# Patient Record
Sex: Female | Born: 1970 | Race: White | Hispanic: No | Marital: Married | Smoking: Current every day smoker
Health system: Southern US, Community
[De-identification: ages and names within clinical notes are randomized; demographics above are authoritative.]

## PROBLEM LIST (undated history)

## (undated) DIAGNOSIS — Z803 Family history of malignant neoplasm of breast: Secondary | ICD-10-CM

## (undated) DIAGNOSIS — Z806 Family history of leukemia: Secondary | ICD-10-CM

## (undated) DIAGNOSIS — E785 Hyperlipidemia, unspecified: Secondary | ICD-10-CM

## (undated) DIAGNOSIS — F32A Depression, unspecified: Secondary | ICD-10-CM

## (undated) DIAGNOSIS — F419 Anxiety disorder, unspecified: Secondary | ICD-10-CM

## (undated) DIAGNOSIS — S0990XA Unspecified injury of head, initial encounter: Secondary | ICD-10-CM

## (undated) DIAGNOSIS — Z923 Personal history of irradiation: Secondary | ICD-10-CM

## (undated) DIAGNOSIS — F329 Major depressive disorder, single episode, unspecified: Secondary | ICD-10-CM

## (undated) DIAGNOSIS — C801 Malignant (primary) neoplasm, unspecified: Secondary | ICD-10-CM

## (undated) DIAGNOSIS — Z9221 Personal history of antineoplastic chemotherapy: Secondary | ICD-10-CM

## (undated) HISTORY — DX: Family history of leukemia: Z80.6

## (undated) HISTORY — DX: Major depressive disorder, single episode, unspecified: F32.9

## (undated) HISTORY — PX: DILATION AND CURETTAGE OF UTERUS: SHX78

## (undated) HISTORY — DX: Unspecified injury of head, initial encounter: S09.90XA

## (undated) HISTORY — DX: Hyperlipidemia, unspecified: E78.5

## (undated) HISTORY — DX: Depression, unspecified: F32.A

## (undated) HISTORY — DX: Anxiety disorder, unspecified: F41.9

## (undated) HISTORY — DX: Family history of malignant neoplasm of breast: Z80.3

---
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
## 1991-04-30 DIAGNOSIS — S0990XA Unspecified injury of head, initial encounter: Secondary | ICD-10-CM

## 1991-04-30 HISTORY — PX: LUNG SURGERY: SHX703

## 1991-04-30 HISTORY — PX: HAND SURGERY: SHX662

## 1991-04-30 HISTORY — DX: Unspecified injury of head, initial encounter: S09.90XA

## 2005-02-20 ENCOUNTER — Inpatient Hospital Stay: Payer: Self-pay | Admitting: Internal Medicine

## 2006-03-04 ENCOUNTER — Ambulatory Visit: Payer: Self-pay | Admitting: Family Medicine

## 2006-03-04 ENCOUNTER — Encounter: Payer: Self-pay | Admitting: Family Medicine

## 2006-03-07 ENCOUNTER — Ambulatory Visit: Payer: Self-pay | Admitting: Family Medicine

## 2006-11-23 ENCOUNTER — Emergency Department: Payer: Self-pay | Admitting: Emergency Medicine

## 2007-06-02 ENCOUNTER — Ambulatory Visit: Payer: Self-pay | Admitting: Internal Medicine

## 2007-07-07 ENCOUNTER — Ambulatory Visit: Payer: Self-pay | Admitting: Family Medicine

## 2007-07-07 ENCOUNTER — Encounter: Payer: Self-pay | Admitting: Family Medicine

## 2008-06-21 ENCOUNTER — Ambulatory Visit: Payer: Self-pay | Admitting: Obstetrics & Gynecology

## 2008-06-22 ENCOUNTER — Encounter: Payer: Self-pay | Admitting: Family Medicine

## 2008-06-22 LAB — CONVERTED CEMR LAB: Yeast Wet Prep HPF POC: NONE SEEN

## 2008-11-30 ENCOUNTER — Encounter: Payer: Self-pay | Admitting: Obstetrics & Gynecology

## 2008-11-30 ENCOUNTER — Ambulatory Visit: Payer: Self-pay | Admitting: Obstetrics & Gynecology

## 2008-12-01 ENCOUNTER — Encounter: Payer: Self-pay | Admitting: Obstetrics & Gynecology

## 2008-12-01 LAB — CONVERTED CEMR LAB
Clue Cells Wet Prep HPF POC: NONE SEEN
Yeast Wet Prep HPF POC: NONE SEEN

## 2008-12-09 ENCOUNTER — Ambulatory Visit: Payer: Self-pay | Admitting: Obstetrics & Gynecology

## 2008-12-09 ENCOUNTER — Encounter (INDEPENDENT_AMBULATORY_CARE_PROVIDER_SITE_OTHER): Payer: Self-pay | Admitting: *Deleted

## 2009-11-12 ENCOUNTER — Ambulatory Visit: Payer: Self-pay | Admitting: Family Medicine

## 2010-04-17 ENCOUNTER — Ambulatory Visit: Payer: Self-pay | Admitting: Family Medicine

## 2010-08-24 ENCOUNTER — Ambulatory Visit: Payer: Self-pay | Admitting: Internal Medicine

## 2010-09-11 NOTE — Assessment & Plan Note (Signed)
NAME:  [name], [name] NO.:  [id number]   MEDICAL RECORD NO.:  [id number]          PATIENT TYPE:  POB   LOCATION:  [facility] at [facility]         FACILITY:  [facility]   PHYSICIAN:  Scheryl Darter, MD       DATE OF BIRTH:  01-12-1971   DATE OF SERVICE:  11/30/2008                                  CLINIC NOTE   CHIEF COMPLAINT:  Need for a physical exam, facial acne, and vaginal  discharge.   The patient is a 40 year old white female gravida 3, para 1, abortus 2,  last menstrual period November 21, 2008.  She is currently not needing any  contraception.  She comes for yearly exam.  She has slight vaginal  discharge, but no itching or odor, and she thinks that it may be because  of ovulation.  She also notes more facial acne and she would like to  have a prescription to help for that.  She plans to quit smoking.  She  had been given Wellbutrin before to help with it and she did not think  it helped.   PAST MEDICAL HISTORY:  Anxiety.   PAST SURGICAL HISTORY:  The patient was curettaged for spontaneous  miscarriages.   PAST OBSTETRIC HISTORY:  The patient has had 2 spontaneous miscarriages  and 1 spontaneous vaginal delivery at term.   FAMILY HISTORY:  Diabetes, breast cancer, and leukemia.   SOCIAL HISTORY:  The patient is single and smoker.  She occasionally  uses alcohol, but denies drug use.   ALLERGIES:  No known drug allergies.   REVIEW OF SYSTEMS:  The patient denies nausea, diarrhea, abdominal pain,  dysuria, change in weight.  She has normal appetite.   PHYSICAL EXAMINATION:  GENERAL:  The patient is in no acute distress.  VITAL SIGNS:  Weight 126 pounds, height 5 feet 4 inches, blood pressure  106/68, pulse 71.  CHEST:  Clear.  HEART:  Regular rate and rhythm.  ABDOMEN:  Soft, nontender.  No masses.  BREASTS:  Without masses.  No cervical lymphadenopathy.  EXTREMITIES:  No swelling.  PELVIC:  External genitalia, vagina, and cervix are normal with a slight  discharge which may be only mucus.  Wet mount was obtained today.  Pap  was done.  Uterus normal size.  No adnexal masses or tenderness.  The  patient does have some facial acne.   IMPRESSION:  1. Normal gynecologic exam, likely no vaginal infections ongoing.  2. Facial acne.  3. Smoker, who wished to quit.   PLAN:  Gave her a prescription for metronidazole gel 0.75% to apply  t.i.d. for acne.  She wants to try Chantix for smoking cessation.  I  gave her a prescription for this to be taken as directed.  She will  return in about a month to review her progress with smoking cessation.  She will begin screening mammograms.       Scheryl Darter, MD     JA/MEDQ  D:  11/30/2008  T:  12/01/2008  Job:  [id number]
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
## 2010-09-11 NOTE — Assessment & Plan Note (Signed)
NAME:  [name] NO.:  [id number]   MEDICAL RECORD NO.:  [id number]          PATIENT TYPE:  POB   LOCATION:  [facility] at [facility]         FACILITY:  [facility]   PHYSICIAN:  Tinnie Gens, MD        DATE OF BIRTH:  08/17/1970   DATE OF SERVICE:                                  CLINIC NOTE   CHIEF COMPLAINT:  Physical exam.   HISTORY OF PRESENT ILLNESS:  The patient is a 40 year old gravida 3,  para 1 who comes in today for yearly exam, her last yearly was November  2007.  Since that time, she has had some problems with breaking out in  hives with unclear etiology for that.  She sees Dr. Arlana Pouch as her PCP who  is trying to do her workup.  The patient reports recurrent UTIs as well.  She has problems with sweating, not sleeping well, worsening acne and  early morning rising. She denies significant depression, although she is  having some marital strife.  She would like birth control.  She  continues to have normal cycles.  She is interested in a NuvaRing. She  continues to smoke, but is interested in quitting.   PAST MEDICAL HISTORY:  Negative.   PAST SURGICAL HISTORY:  She had a D&C and pins in her right forearm.   MEDICATIONS:  1. Feostat 1 p.o. daily.  2. Tylenol p.r.n.  3. Ibuprofen p.r.n.  4. Klonopin 1 p.o. q.h.s.  5. Atarax 1 p.o. q.h.s.   ALLERGIES:  NONE KNOWN.   OBSTETRICAL HISTORY:  She has 2 SABs with DNEs and 1 SVD.   GYNECOLOGICAL HISTORY:  Normal cycles.  No history of abnormal Paps.   FAMILY HISTORY:  Diabetes, breast cancer and leukemia.   SOCIAL HISTORY:  She is a pack per day smoker for the past 14 years.  Social alcohol user.  No drug use.  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   REVIEW OF SYSTEMS:  A 14-point review of systems:  She denies headache,  vision changes, chest pain, shortness of breath, abdominal pain, breast  masses, vaginal discharge, vaginal odor, dysuria, constipation or  diarrhea.  Everything else was  negative except as in the HPI.   PHYSICAL EXAMINATION:  VITAL SIGNS:  Weigh is 124 today blood pressure  109/60, pulse 84.  GENERAL:  This is a well-developed, well-nourished female in no acute  distress.  NECK:  Supple.  HEENT:  Normocephalic, atraumatic.  Sclerae anicteric.  Mouth shows  probable gum disease.  NECK:  Supple.  Normal thyroid.  LUNGS:  Clear bilaterally.  CV:  Regular rate and rhythm without rubs, gallops or murmurs.  ABDOMEN:  Soft, nontender, nondistended.  EXTREMITIES:  No cyanosis, clubbing or edema.  BREASTS:  Symmetric with everted nipples.  No masses.  She does have  fibrocystic change especially in the left outer quadrant of that breast.  No supraclavicular or axillary adenopathy.  GU:  Normal external female genitalia.  BUS is normal.  Vagina is pink  and rugated.  Cervix is parous without lesion.  Uterus is small,  anteverted.  No adnexal mass or tenderness.   IMPRESSION:  1.  Physical exam.  2. Tobacco use.   PLAN:  1. Pap smear today.  2. Wellbutrin 150 mg p.o. b.i.d.  Information given about Quit Line.  3. Check cholesterol, CBC, CMP, TSH, FSH today.           ______________________________  Tinnie Gens, MD     TP/MEDQ  D:  07/07/2007  T:  07/08/2007  Job:  [id number]   cc:   Arlana Pouch, MD
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
## 2012-01-17 ENCOUNTER — Ambulatory Visit: Payer: Self-pay | Admitting: Internal Medicine

## 2012-01-24 ENCOUNTER — Ambulatory Visit (INDEPENDENT_AMBULATORY_CARE_PROVIDER_SITE_OTHER): Payer: BC Managed Care – PPO | Admitting: Obstetrics & Gynecology

## 2012-01-24 ENCOUNTER — Encounter: Payer: Self-pay | Admitting: Obstetrics & Gynecology

## 2012-01-24 VITALS — BP 107/70 | HR 71 | Ht 64.0 in | Wt 128.0 lb

## 2012-01-24 DIAGNOSIS — Z01419 Encounter for gynecological examination (general) (routine) without abnormal findings: Secondary | ICD-10-CM

## 2012-01-24 DIAGNOSIS — Z124 Encounter for screening for malignant neoplasm of cervix: Secondary | ICD-10-CM

## 2012-01-24 DIAGNOSIS — B373 Candidiasis of vulva and vagina: Secondary | ICD-10-CM

## 2012-01-24 DIAGNOSIS — B3731 Acute candidiasis of vulva and vagina: Secondary | ICD-10-CM

## 2012-01-24 DIAGNOSIS — Z1151 Encounter for screening for human papillomavirus (HPV): Secondary | ICD-10-CM

## 2012-01-24 MED ORDER — FLUCONAZOLE 150 MG PO TABS: 150.0000 mg | ORAL_TABLET | Freq: Once | ORAL | Status: DC

## 2012-01-24 NOTE — Progress Notes (Signed)
  Subjective:     [name] is a 41 y.o. G47P1021 female and is here for a comprehensive physical exam. The patient reports no problems.  History   Social History  . Marital Status: Married    Spouse Name: N/A    Number of Children: N/A  . Years of Education: N/A   Occupational History  . Not on file.   Social History Main Topics  . Smoking status: Not on file  . Smokeless tobacco: Not on file  . Alcohol Use: Not on file  . Drug Use: Not on file  . Sexually Active: Not on file   Other Topics Concern  . Not on file   Social History Narrative  . No narrative on file   Health Maintenance  Topic Date Due  . Pap Smear  01/26/1987  . Tetanus/tdap  01/26/1988  . Influenza Vaccine  12/29/2011    The following portions of the patient's history were reviewed and updated as appropriate: allergies, current medications, past family history, past medical history, past social history, past surgical history and problem list.  Review of Systems Pertinent items are noted in HPI.   Objective:   Blood pressure 107/70, pulse 71, height 5\' 4"  (1.626 m), weight 128 lb (58.06 kg), last menstrual period 12/27/2011. GENERAL: Well-developed, well-nourished female in no acute distress.  HEENT: Normocephalic, atraumatic. Sclerae anicteric.  NECK: Supple. Normal thyroid.  LUNGS: Clear to auscultation bilaterally.  HEART: Regular rate and rhythm. BREASTS: Symmetric with everted nipples. No masses, skin changes, nipple drainage, or lymphadenopathy. ABDOMEN: Soft, nontender, nondistended. No organomegaly. PELVIC: Normal external female genitalia. Vagina is pink and rugated.  Normal discharge. Normal cervix contour. Pap smear obtained. Uterus is normal in size. No adnexal mass or tenderness.  EXTREMITIES: No cyanosis, clubbing, or edema, 2+ distal pulses.   Assessment:    Healthy female exam.      Plan:    Pap done, follow up results and manage accordingly. Mammogram scheduled Routine  preventative health maintenance measures emphasized See After Visit Summary for other Counseling Recommendations

## 2012-01-24 NOTE — Patient Instructions (Signed)
Preventive Care for Adults, Female A healthy lifestyle and preventive care can promote health and wellness. Preventive health guidelines for women include the following key practices.  A routine yearly physical is a good way to check with your caregiver about your health and preventive screening. It is a chance to share any concerns and updates on your health, and to receive a thorough exam.   Visit your dentist for a routine exam and preventive care every 6 months. Brush your teeth twice a day and floss once a day. Good oral hygiene prevents tooth decay and gum disease.   The frequency of eye exams is based on your age, health, family medical history, use of contact lenses, and other factors. Follow your caregiver's recommendations for frequency of eye exams.   Eat a healthy diet. Foods like vegetables, fruits, whole grains, low-fat dairy products, and lean protein foods contain the nutrients you need without too many calories. Decrease your intake of foods high in solid fats, added sugars, and salt. Eat the right amount of calories for you.Get information about a proper diet from your caregiver, if necessary.   Regular physical exercise is one of the most important things you can do for your health. Most adults should get at least 150 minutes of moderate-intensity exercise (any activity that increases your heart rate and causes you to sweat) each week. In addition, most adults need muscle-strengthening exercises on 2 or more days a week.   Maintain a healthy weight. The body mass index (BMI) is a screening tool to identify possible weight problems. It provides an estimate of body fat based on height and weight. Your caregiver can help determine your BMI, and can help you achieve or maintain a healthy weight.For adults 20 years and older:   A BMI below 18.5 is considered underweight.   A BMI of 18.5 to 24.9 is normal.   A BMI of 25 to 29.9 is considered overweight.   A BMI of 30 and above  is considered obese.   Maintain normal blood lipids and cholesterol levels by exercising and minimizing your intake of saturated fat. Eat a balanced diet with plenty of fruit and vegetables. Blood tests for lipids and cholesterol should begin at age 54 and be repeated every 5 years. If your lipid or cholesterol levels are high, you are over 50, or you are at high risk for heart disease, you may need your cholesterol levels checked more frequently.Ongoing high lipid and cholesterol levels should be treated with medicines if diet and exercise are not effective.   If you smoke, find out from your caregiver how to quit. If you do not use tobacco, do not start.   If you are pregnant, do not drink alcohol. If you are breastfeeding, be very cautious about drinking alcohol. If you are not pregnant and choose to drink alcohol, do not exceed 1 drink per day. One drink is considered to be 12 ounces (355 mL) of beer, 5 ounces (148 mL) of wine, or 1.5 ounces (44 mL) of liquor.   Avoid use of street drugs. Do not share needles with anyone. Ask for help if you need support or instructions about stopping the use of drugs.   High blood pressure causes heart disease and increases the risk of stroke. Your blood pressure should be checked at least every 1 to 2 years. Ongoing high blood pressure should be treated with medicines if weight loss and exercise are not effective.   If you are 55 to 43  years old, ask your caregiver if you should take aspirin to prevent strokes.   Diabetes screening involves taking a blood sample to check your fasting blood sugar level. This should be done once every 3 years, after age 39, if you are within normal weight and without risk factors for diabetes. Testing should be considered at a younger age or be carried out more frequently if you are overweight and have at least 1 risk factor for diabetes.   Breast cancer screening is essential preventive care for women. You should practice  "breast self-awareness." This means understanding the normal appearance and feel of your breasts and may include breast self-examination. Any changes detected, no matter how small, should be reported to a caregiver. Women in their 56s and 30s should have a clinical breast exam (CBE) by a caregiver as part of a regular health exam every 1 to 3 years. After age 16, women should have a CBE every year. Starting at age 51, women should consider having a mammography (breast X-ray test) every year. Women who have a family history of breast cancer should talk to their caregiver about genetic screening. Women at a high risk of breast cancer should talk to their caregivers about having magnetic resonance imaging (MRI) and a mammography every year.   The Pap test is a screening test for cervical cancer. A Pap test can show cell changes on the cervix that might become cervical cancer if left untreated. A Pap test is a procedure in which cells are obtained and examined from the lower end of the uterus (cervix).   Women should have a Pap test starting at age 30.   Between ages 79 and 67, Pap tests should be repeated every 2 years.   Beginning at age 54, you should have a Pap test every 3 years as long as the past 3 Pap tests have been normal.   Some women have medical problems that increase the chance of getting cervical cancer. Talk to your caregiver about these problems. It is especially important to talk to your caregiver if a new problem develops soon after your last Pap test. In these cases, your caregiver may recommend more frequent screening and Pap tests.   The above recommendations are the same for women who have or have not gotten the vaccine for human papillomavirus (HPV).   If you had a hysterectomy for a problem that was not cancer or a condition that could lead to cancer, then you no longer need Pap tests. Even if you no longer need a Pap test, a regular exam is a good idea to make sure no other  problems are starting.   If you are between ages 63 and 40, and you have had normal Pap tests going back 10 years, you no longer need Pap tests. Even if you no longer need a Pap test, a regular exam is a good idea to make sure no other problems are starting.   If you have had past treatment for cervical cancer or a condition that could lead to cancer, you need Pap tests and screening for cancer for at least 20 years after your treatment.   If Pap tests have been discontinued, risk factors (such as a new sexual partner) need to be reassessed to determine if screening should be resumed.   The HPV test is an additional test that may be used for cervical cancer screening. The HPV test looks for the virus that can cause the cell changes on the cervix.  The cells collected during the Pap test can be tested for HPV. The HPV test could be used to screen women aged 64 years and older, and should be used in women of any age who have unclear Pap test results. After the age of 38, women should have HPV testing at the same frequency as a Pap test.   Colorectal cancer can be detected and often prevented. Most routine colorectal cancer screening begins at the age of 62 and continues through age 35. However, your caregiver may recommend screening at an earlier age if you have risk factors for colon cancer. On a yearly basis, your caregiver may provide home test kits to check for hidden blood in the stool. Use of a small camera at the end of a tube, to directly examine the colon (sigmoidoscopy or colonoscopy), can detect the earliest forms of colorectal cancer. Talk to your caregiver about this at age 11, when routine screening begins. Direct examination of the colon should be repeated every 5 to 10 years through age 47, unless early forms of pre-cancerous polyps or small growths are found.   Hepatitis C blood testing is recommended for all people born from [birth year] through 1965 and any individual with known risks for  hepatitis C.   Practice safe sex. Use condoms and avoid high-risk sexual practices to reduce the spread of sexually transmitted infections (STIs). STIs include gonorrhea, chlamydia, syphilis, trichomonas, herpes, HPV, and human immunodeficiency virus (HIV). Herpes, HIV, and HPV are viral illnesses that have no cure. They can result in disability, cancer, and death. Sexually active women aged 36 and younger should be checked for chlamydia. Older women with new or multiple partners should also be tested for chlamydia. Testing for other STIs is recommended if you are sexually active and at increased risk.   Osteoporosis is a disease in which the bones lose minerals and strength with aging. This can result in serious bone fractures. The risk of osteoporosis can be identified using a bone density scan. Women ages 96 and over and women at risk for fractures or osteoporosis should discuss screening with their caregivers. Ask your caregiver whether you should take a calcium supplement or vitamin D to reduce the rate of osteoporosis.   Menopause can be associated with physical symptoms and risks. Hormone replacement therapy is available to decrease symptoms and risks. You should talk to your caregiver about whether hormone replacement therapy is right for you.   Use sunscreen with sun protection factor (SPF) of 30 or more. Apply sunscreen liberally and repeatedly throughout the day. You should seek shade when your shadow is shorter than you. Protect yourself by wearing long sleeves, pants, a wide-brimmed hat, and sunglasses year round, whenever you are outdoors.   Once a month, do a whole body skin exam, using a mirror to look at the skin on your back. Notify your caregiver of new moles, moles that have irregular borders, moles that are larger than a pencil eraser, or moles that have changed in shape or color.   Stay current with required immunizations.   Influenza. You need a dose every fall (or winter). The  composition of the flu vaccine changes each year, so being vaccinated once is not enough.   Pneumococcal polysaccharide. You need 1 to 2 doses if you smoke cigarettes or if you have certain chronic medical conditions. You need 1 dose at age 67 (or older) if you have never been vaccinated.   Tetanus, diphtheria, pertussis (Tdap, Td). Get 1 dose of  Tdap vaccine if you are younger than age 65, are over 65 and have contact with an infant, are a healthcare worker, are pregnant, or simply want to be protected from whooping cough. After that, you need a Td booster dose every 10 years. Consult your caregiver if you have not had at least 3 tetanus and diphtheria-containing shots sometime in your life or have a deep or dirty wound.   HPV. You need this vaccine if you are a woman age 26 or younger. The vaccine is given in 3 doses over 6 months.   Measles, mumps, rubella (MMR). You need at least 1 dose of MMR if you were born in 1957 or later. You may also need a second dose.   Meningococcal. If you are age 19 to 21 and a first-year college student living in a residence hall, or have one of several medical conditions, you need to get vaccinated against meningococcal disease. You may also need additional booster doses.   Zoster (shingles). If you are age 60 or older, you should get this vaccine.   Varicella (chickenpox). If you have never had chickenpox or you were vaccinated but received only 1 dose, talk to your caregiver to find out if you need this vaccine.   Hepatitis A. You need this vaccine if you have a specific risk factor for hepatitis A virus infection or you simply wish to be protected from this disease. The vaccine is usually given as 2 doses, 6 to 18 months apart.   Hepatitis B. You need this vaccine if you have a specific risk factor for hepatitis B virus infection or you simply wish to be protected from this disease. The vaccine is given in 3 doses, usually over 6 months.  Preventive Services /  Frequency Ages 19 to 39  Blood pressure check.** / Every 1 to 2 years.   Lipid and cholesterol check.** / Every 5 years beginning at age 20.   Clinical breast exam.** / Every 3 years for women in their 20s and 30s.   Pap test.** / Every 2 years from ages 21 through 29. Every 3 years starting at age 30 through age 65 or 70 with a history of 3 consecutive normal Pap tests.   HPV screening.** / Every 3 years from ages 30 through ages 65 to 70 with a history of 3 consecutive normal Pap tests.   Hepatitis C blood test.** / For any individual with known risks for hepatitis C.   Skin self-exam. / Monthly.   Influenza immunization.** / Every year.   Pneumococcal polysaccharide immunization.** / 1 to 2 doses if you smoke cigarettes or if you have certain chronic medical conditions.   Tetanus, diphtheria, pertussis (Tdap, Td) immunization. / A one-time dose of Tdap vaccine. After that, you need a Td booster dose every 10 years.   HPV immunization. / 3 doses over 6 months, if you are 26 and younger.   Measles, mumps, rubella (MMR) immunization. / You need at least 1 dose of MMR if you were born in 1957 or later. You may also need a second dose.   Meningococcal immunization. / 1 dose if you are age 19 to 21 and a first-year college student living in a residence hall, or have one of several medical conditions, you need to get vaccinated against meningococcal disease. You may also need additional booster doses.   Varicella immunization.** / Consult your caregiver.   Hepatitis A immunization.** / Consult your caregiver. 2 doses, 6 to 18 months   apart.   Hepatitis B immunization.** / Consult your caregiver. 3 doses usually over 6 months.  Ages 40 to 64  Blood pressure check.** / Every 1 to 2 years.   Lipid and cholesterol check.** / Every 5 years beginning at age 20.   Clinical breast exam.** / Every year after age 40.   Mammogram.** / Every year beginning at age 40 and continuing for as  long as you are in good health. Consult with your caregiver.   Pap test.** / Every 3 years starting at age 30 through age 65 or 70 with a history of 3 consecutive normal Pap tests.   HPV screening.** / Every 3 years from ages 30 through ages 65 to 70 with a history of 3 consecutive normal Pap tests.   Fecal occult blood test (FOBT) of stool. / Every year beginning at age 50 and continuing until age 75. You may not need to do this test if you get a colonoscopy every 10 years.   Flexible sigmoidoscopy or colonoscopy.** / Every 5 years for a flexible sigmoidoscopy or every 10 years for a colonoscopy beginning at age 50 and continuing until age 75.   Hepatitis C blood test.** / For all people born from 1945 through 1965 and any individual with known risks for hepatitis C.   Skin self-exam. / Monthly.   Influenza immunization.** / Every year.   Pneumococcal polysaccharide immunization.** / 1 to 2 doses if you smoke cigarettes or if you have certain chronic medical conditions.   Tetanus, diphtheria, pertussis (Tdap, Td) immunization.** / A one-time dose of Tdap vaccine. After that, you need a Td booster dose every 10 years.   Measles, mumps, rubella (MMR) immunization. / You need at least 1 dose of MMR if you were born in 1957 or later. You may also need a second dose.   Varicella immunization.** / Consult your caregiver.   Meningococcal immunization.** / Consult your caregiver.   Hepatitis A immunization.** / Consult your caregiver. 2 doses, 6 to 18 months apart.   Hepatitis B immunization.** / Consult your caregiver. 3 doses, usually over 6 months.  Ages 65 and over  Blood pressure check.** / Every 1 to 2 years.   Lipid and cholesterol check.** / Every 5 years beginning at age 20.   Clinical breast exam.** / Every year after age 40.   Mammogram.** / Every year beginning at age 40 and continuing for as long as you are in good health. Consult with your caregiver.   Pap test.** /  Every 3 years starting at age 30 through age 65 or 70 with a 3 consecutive normal Pap tests. Testing can be stopped between 65 and 70 with 3 consecutive normal Pap tests and no abnormal Pap or HPV tests in the past 10 years.   HPV screening.** / Every 3 years from ages 30 through ages 65 or 70 with a history of 3 consecutive normal Pap tests. Testing can be stopped between 65 and 70 with 3 consecutive normal Pap tests and no abnormal Pap or HPV tests in the past 10 years.   Fecal occult blood test (FOBT) of stool. / Every year beginning at age 50 and continuing until age 75. You may not need to do this test if you get a colonoscopy every 10 years.   Flexible sigmoidoscopy or colonoscopy.** / Every 5 years for a flexible sigmoidoscopy or every 10 years for a colonoscopy beginning at age 50 and continuing until age 75.   Hepatitis   C blood test.** / For all people born from [birth year] through 1967 and any individual with known risks for hepatitis C.   Osteoporosis screening.** / A one-time screening for women ages 21 and over and women at risk for fractures or osteoporosis.   Skin self-exam. / Monthly.   Influenza immunization.** / Every year.   Pneumococcal polysaccharide immunization.** / 1 dose at age 25 (or older) if you have never been vaccinated.   Tetanus, diphtheria, pertussis (Tdap, Td) immunization. / A one-time dose of Tdap vaccine if you are over 65 and have contact with an infant, are a Research scientist (physical sciences), or simply want to be protected from whooping cough. After that, you need a Td booster dose every 10 years.   Varicella immunization.** / Consult your caregiver.   Meningococcal immunization.** / Consult your caregiver.   Hepatitis A immunization.** / Consult your caregiver. 2 doses, 6 to 18 months apart.   Hepatitis B immunization.** / Check with your caregiver. 3 doses, usually over 6 months.  ** Family history and personal history of risk and conditions may change your caregiver's  recommendations. Document Released: 06/11/2001 Document Revised: 04/04/2011 Document Reviewed: 09/10/2010 [facility] Patient Information 2012 [place], [place]. Thank you for enrolling in MyChart. Please follow the instructions below to securely access your online medical record. MyChart allows you to send messages to your doctor, view your test results, manage appointments, and more.   How Do I Sign Up? 1. In your Internet browser, go to [employer] and enter [email or web address]. 2. Click on the Sign Up Now link in the Sign In box. You will see the New Member Sign Up page. 3. Enter your MyChart Access Code exactly as it appears below. You will not need to use this code after you've completed the sign-up process. If you do not sign up before the expiration date, you must request a new code. MyChart Access Code: [email or web address] Expires: 02/23/2012  9:43 AM  4. Enter your Social Security Number ([id number]) and Date of Birth (mm/dd/yyyy) as indicated and click Submit. You will be taken to the next sign-up page. 5. Create a MyChart ID. This will be your MyChart login ID and cannot be changed, so think of one that is secure and easy to remember. 6. Create a MyChart password. You can change your password at any time. 7. Enter your Password Reset Question and Answer. This can be used at a later time if you forget your password.  8. Enter your e-mail address. You will receive e-mail notification when new information is available in MyChart. 9. Click Sign Up. You can now view your medical record.   Additional Information Remember, MyChart is NOT to be used for urgent needs. For medical emergencies, dial 911.

## 2012-02-03 ENCOUNTER — Encounter: Payer: Self-pay | Admitting: Obstetrics & Gynecology

## 2012-03-02 ENCOUNTER — Telehealth: Payer: Self-pay | Admitting: *Deleted

## 2012-03-02 DIAGNOSIS — B373 Candidiasis of vulva and vagina: Secondary | ICD-10-CM

## 2012-03-02 MED ORDER — FLUCONAZOLE 150 MG PO TABS: 150.0000 mg | ORAL_TABLET | Freq: Once | ORAL | Status: DC

## 2012-03-02 NOTE — Telephone Encounter (Signed)
Patient is having a yeast infection and would like meds to be called in.

## 2012-06-15 ENCOUNTER — Ambulatory Visit: Payer: Self-pay

## 2014-02-25 ENCOUNTER — Emergency Department: Payer: Self-pay | Admitting: Emergency Medicine

## 2014-02-25 LAB — CBC WITH DIFFERENTIAL/PLATELET
Basophil #: 0.1 10*3/uL (ref 0.0–0.1)
Basophil %: 0.5 %
EOS ABS: 0 10*3/uL (ref 0.0–0.7)
EOS PCT: 0 %
HCT: 43.6 % (ref 35.0–47.0)
HGB: 14.2 g/dL (ref 12.0–16.0)
LYMPHS ABS: 1.2 10*3/uL (ref 1.0–3.6)
LYMPHS PCT: 7.7 %
MCH: 31.5 pg (ref 26.0–34.0)
MCHC: 32.6 g/dL (ref 32.0–36.0)
MCV: 97 fL (ref 80–100)
MONO ABS: 0.3 x10 3/mm (ref 0.2–0.9)
Monocyte %: 1.9 %
NEUTROS ABS: 13.7 10*3/uL — AB (ref 1.4–6.5)
NEUTROS PCT: 89.9 %
Platelet: 296 10*3/uL (ref 150–440)
RBC: 4.51 10*6/uL (ref 3.80–5.20)
RDW: 14.1 % (ref 11.5–14.5)
WBC: 15.2 10*3/uL — ABNORMAL HIGH (ref 3.6–11.0)

## 2014-02-25 LAB — URINALYSIS, COMPLETE
Bilirubin,UR: NEGATIVE
Blood: NEGATIVE
GLUCOSE, UR: NEGATIVE mg/dL (ref 0–75)
Leukocyte Esterase: NEGATIVE
Nitrite: NEGATIVE
Ph: 8 (ref 4.5–8.0)
Protein: 100
RBC,UR: 8 /HPF (ref 0–5)
Specific Gravity: 1.025 (ref 1.003–1.030)
Squamous Epithelial: 10
WBC UR: 1 /HPF (ref 0–5)

## 2014-02-25 LAB — COMPREHENSIVE METABOLIC PANEL
ALK PHOS: 59 U/L
ANION GAP: 7 (ref 7–16)
Albumin: 3.9 g/dL (ref 3.4–5.0)
BUN: 8 mg/dL (ref 7–18)
Bilirubin,Total: 0.4 mg/dL (ref 0.2–1.0)
CALCIUM: 8.9 mg/dL (ref 8.5–10.1)
CHLORIDE: 105 mmol/L (ref 98–107)
Co2: 28 mmol/L (ref 21–32)
Creatinine: 0.81 mg/dL (ref 0.60–1.30)
EGFR (Non-African Amer.): >60
Glucose: 137 mg/dL — ABNORMAL HIGH (ref 65–99)
OSMOLALITY: 280 (ref 275–301)
POTASSIUM: 4.2 mmol/L (ref 3.5–5.1)
SGOT(AST): 19 U/L (ref 15–37)
SGPT (ALT): 18 U/L
Sodium: 140 mmol/L (ref 136–145)
TOTAL PROTEIN: 7.7 g/dL (ref 6.4–8.2)

## 2014-02-25 LAB — LIPASE, BLOOD: Lipase: 118 U/L (ref 73–393)

## 2014-02-28 ENCOUNTER — Encounter: Payer: Self-pay | Admitting: Obstetrics & Gynecology

## 2016-05-22 DIAGNOSIS — E782 Mixed hyperlipidemia: Secondary | ICD-10-CM | POA: Insufficient documentation

## 2016-05-22 DIAGNOSIS — Z789 Other specified health status: Secondary | ICD-10-CM | POA: Insufficient documentation

## 2016-08-23 DIAGNOSIS — F17200 Nicotine dependence, unspecified, uncomplicated: Secondary | ICD-10-CM | POA: Insufficient documentation

## 2017-07-07 ENCOUNTER — Other Ambulatory Visit: Payer: Self-pay

## 2017-07-07 ENCOUNTER — Ambulatory Visit
Admission: EM | Admit: 2017-07-07 | Discharge: 2017-07-07 | Disposition: A | Discharge status: Home / Self Care | Payer: 59 | Attending: Family Medicine | Admitting: Family Medicine

## 2017-07-07 ENCOUNTER — Ambulatory Visit (INDEPENDENT_AMBULATORY_CARE_PROVIDER_SITE_OTHER): Payer: 59

## 2017-07-07 DIAGNOSIS — M25561 Pain in right knee: Secondary | ICD-10-CM

## 2017-07-07 DIAGNOSIS — W19XXXA Unspecified fall, initial encounter: Secondary | ICD-10-CM | POA: Diagnosis not present

## 2017-07-07 DIAGNOSIS — S8391XA Sprain of unspecified site of right knee, initial encounter: Secondary | ICD-10-CM

## 2017-07-07 MED ORDER — KETOROLAC TROMETHAMINE 60 MG/2ML IM SOLN
60.0000 mg | Freq: Once | INTRAMUSCULAR | Status: AC
  Administered 2017-07-07: 60 mg via INTRAMUSCULAR

## 2017-07-07 MED ORDER — TRAMADOL HCL 50 MG PO TABS: ORAL_TABLET | ORAL | 0 refills | Status: DC
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
## 2017-07-07 NOTE — ED Triage Notes (Signed)
Patient complains of right knee pain that occurred when she fell on to ceramic tile 10 days ago. Patient states that she has been using ice without relief. Patient states that the swelling has been continuous and worsens with positional changes.

## 2017-07-07 NOTE — Discharge Instructions (Addendum)
Rest, ice, elevation, ibuprofen

## 2018-12-28 DIAGNOSIS — E538 Deficiency of other specified B group vitamins: Secondary | ICD-10-CM | POA: Insufficient documentation

## 2019-01-01 ENCOUNTER — Other Ambulatory Visit: Payer: Self-pay | Admitting: Family Medicine

## 2019-01-01 DIAGNOSIS — Z1231 Encounter for screening mammogram for malignant neoplasm of breast: Secondary | ICD-10-CM

## 2019-01-05 ENCOUNTER — Other Ambulatory Visit: Payer: Self-pay | Admitting: Family Medicine

## 2019-01-05 DIAGNOSIS — N63 Unspecified lump in unspecified breast: Secondary | ICD-10-CM

## 2019-01-10 DIAGNOSIS — N63 Unspecified lump in unspecified breast: Secondary | ICD-10-CM | POA: Insufficient documentation

## 2019-01-21 ENCOUNTER — Ambulatory Visit
Admission: RE | Admit: 2019-01-21 | Discharge: 2019-01-21 | Disposition: A | Discharge status: Home / Self Care | Payer: 59 | Source: Ambulatory Visit | Attending: Family Medicine | Admitting: Family Medicine

## 2019-01-21 DIAGNOSIS — N63 Unspecified lump in unspecified breast: Secondary | ICD-10-CM

## 2019-01-25 ENCOUNTER — Other Ambulatory Visit: Payer: Self-pay | Admitting: Family Medicine

## 2019-01-26 ENCOUNTER — Other Ambulatory Visit: Payer: Self-pay | Admitting: Family Medicine

## 2019-01-26 DIAGNOSIS — R928 Other abnormal and inconclusive findings on diagnostic imaging of breast: Secondary | ICD-10-CM

## 2019-01-26 DIAGNOSIS — N6320 Unspecified lump in the left breast, unspecified quadrant: Secondary | ICD-10-CM

## 2019-01-28 DIAGNOSIS — C50919 Malignant neoplasm of unspecified site of unspecified female breast: Secondary | ICD-10-CM

## 2019-01-28 HISTORY — DX: Malignant neoplasm of unspecified site of unspecified female breast: C50.919

## 2019-02-03 ENCOUNTER — Ambulatory Visit
Admission: RE | Admit: 2019-02-03 | Discharge: 2019-02-03 | Disposition: A | Discharge status: Home / Self Care | Payer: 59 | Source: Ambulatory Visit | Attending: Family Medicine | Admitting: Family Medicine

## 2019-02-03 DIAGNOSIS — N6320 Unspecified lump in the left breast, unspecified quadrant: Secondary | ICD-10-CM

## 2019-02-03 DIAGNOSIS — R928 Other abnormal and inconclusive findings on diagnostic imaging of breast: Secondary | ICD-10-CM

## 2019-02-03 HISTORY — PX: BREAST BIOPSY: SHX20

## 2019-02-04 DIAGNOSIS — C801 Malignant (primary) neoplasm, unspecified: Secondary | ICD-10-CM

## 2019-02-04 HISTORY — DX: Malignant (primary) neoplasm, unspecified: C80.1

## 2019-02-05 ENCOUNTER — Other Ambulatory Visit: Payer: Self-pay

## 2019-02-05 DIAGNOSIS — C50919 Malignant neoplasm of unspecified site of unspecified female breast: Secondary | ICD-10-CM

## 2019-02-09 DIAGNOSIS — C50412 Malignant neoplasm of upper-outer quadrant of left female breast: Secondary | ICD-10-CM | POA: Insufficient documentation

## 2019-02-09 NOTE — Progress Notes (Signed)
  Oncology Nurse Navigator Documentation  Navigator Location: [facility] (02/09/19 1600)   )Navigator Encounter Type: Introductory Phone Call (02/09/19 1600)   Abnormal Finding Date: 01/21/19 (02/09/19 1600) Confirmed Diagnosis Date: 02/05/19 (02/09/19 1600)               Patient Visit Type: Initial (02/09/19 1600)   Barriers/Navigation Needs: Anxiety;Coordination of Care (02/09/19 1600)   Interventions: Coordination of Care;Psycho-Social Support (02/09/19 1600)   Coordination of Care: Appts (02/09/19 1600)                  Time Spent with Patient: 60 (02/09/19 1600) Introduced IT trainer.  Scheduled Med/Onc, and Surgical Consult.  Patient requests Dr. Rogue Bussing.  Message sent to referral coordinator.

## 2019-02-10 ENCOUNTER — Other Ambulatory Visit: Payer: Self-pay | Admitting: Internal Medicine

## 2019-02-10 LAB — SURGICAL PATHOLOGY

## 2019-02-11 ENCOUNTER — Emergency Department: Payer: 59

## 2019-02-11 ENCOUNTER — Encounter: Payer: Self-pay | Admitting: Emergency Medicine

## 2019-02-11 ENCOUNTER — Inpatient Hospital Stay: Payer: 59

## 2019-02-11 ENCOUNTER — Other Ambulatory Visit: Payer: Self-pay

## 2019-02-11 ENCOUNTER — Encounter: Payer: Self-pay | Admitting: Internal Medicine

## 2019-02-11 ENCOUNTER — Emergency Department
Admission: EM | Admit: 2019-02-11 | Discharge: 2019-02-11 | Disposition: A | Discharge status: Home / Self Care | Payer: 59 | Attending: Emergency Medicine | Admitting: Emergency Medicine

## 2019-02-11 ENCOUNTER — Inpatient Hospital Stay: Payer: 59 | Attending: Oncology | Admitting: Internal Medicine

## 2019-02-11 VITALS — BP 122/84 | HR 88 | Temp 96.6°F | Resp 16 | Wt 123.6 lb

## 2019-02-11 DIAGNOSIS — W010XXA Fall on same level from slipping, tripping and stumbling without subsequent striking against object, initial encounter: Secondary | ICD-10-CM | POA: Diagnosis not present

## 2019-02-11 DIAGNOSIS — S52502A Unspecified fracture of the lower end of left radius, initial encounter for closed fracture: Secondary | ICD-10-CM | POA: Diagnosis not present

## 2019-02-11 DIAGNOSIS — F17210 Nicotine dependence, cigarettes, uncomplicated: Secondary | ICD-10-CM | POA: Insufficient documentation

## 2019-02-11 DIAGNOSIS — Z171 Estrogen receptor negative status [ER-]: Secondary | ICD-10-CM

## 2019-02-11 DIAGNOSIS — F419 Anxiety disorder, unspecified: Secondary | ICD-10-CM

## 2019-02-11 DIAGNOSIS — S52602A Unspecified fracture of lower end of left ulna, initial encounter for closed fracture: Secondary | ICD-10-CM | POA: Diagnosis not present

## 2019-02-11 DIAGNOSIS — Z5111 Encounter for antineoplastic chemotherapy: Secondary | ICD-10-CM

## 2019-02-11 DIAGNOSIS — Z23 Encounter for immunization: Secondary | ICD-10-CM | POA: Insufficient documentation

## 2019-02-11 DIAGNOSIS — M8580 Other specified disorders of bone density and structure, unspecified site: Secondary | ICD-10-CM

## 2019-02-11 DIAGNOSIS — Y92008 Other place in unspecified non-institutional (private) residence as the place of occurrence of the external cause: Secondary | ICD-10-CM | POA: Insufficient documentation

## 2019-02-11 DIAGNOSIS — F329 Major depressive disorder, single episode, unspecified: Secondary | ICD-10-CM

## 2019-02-11 DIAGNOSIS — C50812 Malignant neoplasm of overlapping sites of left female breast: Secondary | ICD-10-CM

## 2019-02-11 DIAGNOSIS — E785 Hyperlipidemia, unspecified: Secondary | ICD-10-CM

## 2019-02-11 DIAGNOSIS — Y9389 Activity, other specified: Secondary | ICD-10-CM | POA: Diagnosis not present

## 2019-02-11 DIAGNOSIS — C50919 Malignant neoplasm of unspecified site of unspecified female breast: Secondary | ICD-10-CM

## 2019-02-11 DIAGNOSIS — Y999 Unspecified external cause status: Secondary | ICD-10-CM | POA: Insufficient documentation

## 2019-02-11 DIAGNOSIS — S6992XA Unspecified injury of left wrist, hand and finger(s), initial encounter: Secondary | ICD-10-CM | POA: Diagnosis present

## 2019-02-11 DIAGNOSIS — Z79899 Other long term (current) drug therapy: Secondary | ICD-10-CM | POA: Insufficient documentation

## 2019-02-11 LAB — CBC WITH DIFFERENTIAL/PLATELET
Abs Immature Granulocytes: 0.02 10*3/uL (ref 0.00–0.07)
Basophils Absolute: 0.1 10*3/uL (ref 0.0–0.1)
Basophils Relative: 1 %
Eosinophils Absolute: 0.1 10*3/uL (ref 0.0–0.5)
Eosinophils Relative: 0 %
HCT: 46.5 % — ABNORMAL HIGH (ref 36.0–46.0)
Hemoglobin: 15.7 g/dL — ABNORMAL HIGH (ref 12.0–15.0)
Immature Granulocytes: 0 %
Lymphocytes Relative: 31 %
Lymphs Abs: 3.5 10*3/uL (ref 0.7–4.0)
MCH: 33.9 pg (ref 26.0–34.0)
MCHC: 33.8 g/dL (ref 30.0–36.0)
MCV: 100.4 fL — ABNORMAL HIGH (ref 80.0–100.0)
Monocytes Absolute: 1 10*3/uL (ref 0.1–1.0)
Monocytes Relative: 8 %
Neutro Abs: 6.7 10*3/uL (ref 1.7–7.7)
Neutrophils Relative %: 60 %
Platelets: 386 10*3/uL (ref 150–400)
RBC: 4.63 MIL/uL (ref 3.87–5.11)
RDW: 13.2 % (ref 11.5–15.5)
WBC: 11.3 10*3/uL — ABNORMAL HIGH (ref 4.0–10.5)
nRBC: 0 % (ref 0.0–0.2)

## 2019-02-11 LAB — COMPREHENSIVE METABOLIC PANEL
ALT: 42 U/L (ref 0–44)
AST: 60 U/L — ABNORMAL HIGH (ref 15–41)
Albumin: 4.8 g/dL (ref 3.5–5.0)
Alkaline Phosphatase: 65 U/L (ref 38–126)
Anion gap: 12 (ref 5–15)
BUN: 7 mg/dL (ref 6–20)
CO2: 24 mmol/L (ref 22–32)
Calcium: 10 mg/dL (ref 8.9–10.3)
Chloride: 102 mmol/L (ref 98–111)
Creatinine, Ser: 0.57 mg/dL (ref 0.44–1.00)
GFR calc Af Amer: >60 mL/min (ref >60)
GFR calc non Af Amer: >60 mL/min (ref >60)
Glucose, Bld: 92 mg/dL (ref 70–99)
Potassium: 4 mmol/L (ref 3.5–5.1)
Sodium: 138 mmol/L (ref 135–145)
Total Bilirubin: 0.6 mg/dL (ref 0.3–1.2)
Total Protein: 8.5 g/dL — ABNORMAL HIGH (ref 6.5–8.1)

## 2019-02-11 MED ORDER — OXYCODONE HCL 5 MG PO TABS: 5.0000 mg | ORAL_TABLET | Freq: Three times a day (TID) | ORAL | 0 refills | Status: DC | PRN

## 2019-02-11 MED ORDER — ONDANSETRON HCL 4 MG/2ML IJ SOLN
4.0000 mg | Freq: Once | INTRAMUSCULAR | Status: AC
  Administered 2019-02-11: 4 mg via INTRAVENOUS
  Filled 2019-02-11: qty 2

## 2019-02-11 MED ORDER — MELOXICAM 15 MG PO TABS: 15.0000 mg | ORAL_TABLET | Freq: Every day | ORAL | 0 refills | Status: DC

## 2019-02-11 MED ORDER — FENTANYL CITRATE (PF) 100 MCG/2ML IJ SOLN
100.0000 ug | Freq: Once | INTRAMUSCULAR | Status: AC
  Administered 2019-02-11: 100 ug via INTRAVENOUS
  Filled 2019-02-11 (×2): qty 2

## 2019-02-11 NOTE — Discharge Instructions (Signed)
Please call Dr. Clydell Hakim office in the morning to schedule an appointment.  Return to the ER for symptoms of concern.  If the temporary cast feels too tight. Loosen the ACE wrap, but do not take the hard casting material off.

## 2019-02-11 NOTE — ED Notes (Addendum)
Pt fell while taking trash out, fell on left wrist. Obvious wrist deformity. Wrist elevated and ice bag applied.

## 2019-02-11 NOTE — Research (Signed)
I met with patient todayafter her appointment with Dr. Loletta Parish review study "Procurement of Human Biospecimens for the Discovery and Validation of Biomarkers for the Predication, Diagnosis and Management of Disease" sponsored by [employer]. Dr. Rogue Bussing discussed the study with the patient prior to my entry into the patient room.Ifurtherexplained the study to patient including purpose of the study, risks/benefits, participation requirements, voluntary participation and reimbursement provided by the study. Patient agreeable to participation. Patient signed consent and HIPPA for study and copy of signed forms given to patient. Per study guidelines, patient was asked if she had Novocain in the past week or had any history of cancer. Patient answered no to both of these questions. (If the patient were to have answered yes, she would be disqualified from study). Lab appointment was already scheduled for today after physician visit.  Blood collected for research study during this lab appointment and patient was given gift card provided by [facility].  Patient was thanked for her participation.   [place] oncology research associate  Lula Olszewski Oncology Research Associate 02/11/2019 12:31 PM

## 2019-02-11 NOTE — Assessment & Plan Note (Addendum)
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   However concern for left upper inner quadrant 11:00 lesion 0.6 x 0.9 cm-this was not biopsied for unclear reasons.  # Agree with MRI of the breast-for further characterization of the breast lesions/given the multifocality.   #Discussed with the patient that this nature of her breast cancer.  However await staging based upon above MRI.  Given the HER-2/neu positive disease I would recommend systemic chemotherapy.  #Patient leaning towards mastectomy as a surgical option.  She understands she will need sentinel lymph node biopsy.  #With regards to systemic therapy-the options include adjuvant therapy post surgery vs. neoadjuvant chemotherapy [TCH+P]; to finish a complete 1 year of HER-2 directed therapy.  #Given the ER/PR negative- HER-2/neu POSITIVE disease/multifocality/young patient/who wants to be aggressive-I think it is reasonable to consider neoadjuvant chemotherapy with TCH+P upfront.  This would also allow [place] to assess for pathologic response; assess for modification of adjuvant therapy [HP vs TDM-1].  However I would wait on the MRI before making final recommendations.  This was discussed with Dr. Lysle Pearl.  She concurs.  Patient need a port placement.  #My absence patient will follow up with Dr.Rao/to discuss the results of the MRI/follow-up plan.  #Patient will need genetic testing/counseling.  #Active smoker-recommended to quit smoking.  # # I offered to call the patient's family to give an update on clinical status; patient declines.   #Patient screened for the [employer]/clinical trial.   Thank you Dr.George for allowing me to participate in the care of your pleasant patient. Please do not hesitate to contact me with questions or concerns in the interim.  Also discussed with Al Pimple.    #  DISPOSITION:   # labs today- cbc/cmp-  # Chemotherapy education-  # MUGA scan ASAP #Mediport placement- Dr.Sakai #Antiemetics-Zofran/ Compazine/ EMLA cream # MRI ASAP # Follow up X- MD- 1-2 days after MRI- Dr.B

## 2019-02-11 NOTE — Progress Notes (Signed)
New patient referred by Dr Iona Beard, reports has had night sweats for past 2 years.
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
## 2019-02-11 NOTE — ED Triage Notes (Signed)
PT reports mechanical fall causing injury to left wrist.
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
## 2019-02-12 ENCOUNTER — Encounter: Payer: Self-pay | Admitting: *Deleted

## 2019-02-12 ENCOUNTER — Telehealth: Payer: Self-pay | Admitting: *Deleted

## 2019-02-12 ENCOUNTER — Telehealth: Payer: Self-pay | Admitting: Internal Medicine

## 2019-02-12 MED ORDER — LIDOCAINE-PRILOCAINE 2.5-2.5 % EX CREA: 1.0000 "application " | TOPICAL_CREAM | CUTANEOUS | 0 refills | Status: DC | PRN

## 2019-02-12 MED ORDER — PROCHLORPERAZINE MALEATE 10 MG PO TABS: 10.0000 mg | ORAL_TABLET | Freq: Four times a day (QID) | ORAL | 1 refills | Status: DC | PRN

## 2019-02-12 MED ORDER — ONDANSETRON HCL 8 MG PO TABS: ORAL_TABLET | ORAL | 1 refills | Status: DC

## 2019-02-12 NOTE — Telephone Encounter (Signed)
Patient called reporting that she fell and broke her wrist. She is requesting that [name] return her call to discuss her situation. [phone]

## 2019-02-12 NOTE — Telephone Encounter (Signed)
MD made aware of pt's situation with broken wrist

## 2019-02-12 NOTE — Telephone Encounter (Signed)
[name] please schedule the patient with Dr. Janese Banks on 10/20; to review the results of the MRI/plan of care. Thanks

## 2019-02-12 NOTE — Progress Notes (Signed)
Patient called to inform us that she had fallen and broke her wrist last night.  She is going to need surgery, but is waiting to hear from her surgeon with her surgery date.  She is to let me or Webb Silversmith know as soon as possible so we can rearrange some of her appointments.  She is agreeable.

## 2019-02-13 ENCOUNTER — Telehealth: Payer: Self-pay | Admitting: Internal Medicine

## 2019-02-13 NOTE — Telephone Encounter (Signed)
Spoke to patient regarding recent fall fracture of her wrist. Patient awaiting to see orthopedic surgeon on 10/19 a.m.  Patient awaiting MRI 10/19 later in the day.  Discussed with the patient is obviously complicates the situation-however we will coordinate between the different specialties to come up with the best treatment plan [upfront surgery/neoadjuvant chemo].  For now await MRI of the breast.   FYI-Dr.Sakai/Rao.

## 2019-02-15 ENCOUNTER — Ambulatory Visit
Admission: RE | Admit: 2019-02-15 | Discharge: 2019-02-15 | Disposition: A | Discharge status: Home / Self Care | Payer: 59 | Source: Ambulatory Visit | Attending: Internal Medicine | Admitting: Internal Medicine

## 2019-02-15 ENCOUNTER — Other Ambulatory Visit: Payer: Self-pay

## 2019-02-15 DIAGNOSIS — C50919 Malignant neoplasm of unspecified site of unspecified female breast: Secondary | ICD-10-CM

## 2019-02-15 MED ORDER — GADOBUTROL 1 MMOL/ML IV SOLN
5.0000 mL | Freq: Once | INTRAVENOUS | Status: AC | PRN
  Administered 2019-02-15: 5 mL via INTRAVENOUS
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
## 2019-02-15 NOTE — Discharge Instructions (Signed)
°  Instructions after Hand / Wrist Surgery   Joy Kaney [name], M.D.  Dept. of [facility]  [name], [place]  [address]   Phone: [phone]   Fax: [phone]   DIET:  Drink plenty of non-alcoholic fluids & begin a light diet.  Resume your normal diet the day after surgery.  ACTIVITY:   Keep the hand elevated above the level of the elbow.  Begin gently moving the fingers on a regular basis to avoid stiffness.  Avoid any heavy lifting, pushing, or pulling with the operative hand.  Do not drive or operate any equipment until instructed.  WOUND CARE:   Keep the splint/bandage clean and dry.   The splint and stitches will be removed in the office.  Continue to use the ice packs periodically to reduce pain and swelling.  You may bathe or shower after the stitches are removed at the first office visit following surgery.  MEDICATIONS:  You may resume your regular medications.  Please take the pain medication as prescribed.  Do not take pain medication on an empty stomach.  Do not drive or drink alcoholic beverages when taking pain medications.  CALL THE OFFICE FOR:  Temperature above 101 degrees  Excessive bleeding or drainage on the dressing.  Excessive swelling, coldness, or paleness of the fingers.  Persistent nausea and vomiting.  FOLLOW-UP:   You should have an appointment to return to the office in 7-10 days after surgery.   REMEMBER: R.I.C.E. = Rest, Ice, Compression, Elevation !

## 2019-02-15 NOTE — Progress Notes (Signed)
Patient is coming in for follow up, she fell last week broke her wrist  and is going to have surgery later this week.

## 2019-02-16 ENCOUNTER — Inpatient Hospital Stay: Payer: 59

## 2019-02-16 ENCOUNTER — Other Ambulatory Visit: Payer: Self-pay | Admitting: *Deleted

## 2019-02-16 ENCOUNTER — Telehealth: Payer: Self-pay | Admitting: *Deleted

## 2019-02-16 ENCOUNTER — Encounter
Admission: RE | Admit: 2019-02-16 | Discharge: 2019-02-16 | Disposition: A | Discharge status: Home / Self Care | Payer: 59 | Source: Ambulatory Visit | Attending: Orthopedic Surgery | Admitting: Orthopedic Surgery

## 2019-02-16 ENCOUNTER — Inpatient Hospital Stay (HOSPITAL_BASED_OUTPATIENT_CLINIC_OR_DEPARTMENT_OTHER): Payer: 59 | Admitting: Oncology

## 2019-02-16 ENCOUNTER — Other Ambulatory Visit: Payer: Self-pay | Admitting: Oncology

## 2019-02-16 ENCOUNTER — Other Ambulatory Visit: Payer: Self-pay

## 2019-02-16 VITALS — BP 122/84 | HR 84 | Temp 97.7°F | Resp 20 | Ht 64.0 in | Wt 129.0 lb

## 2019-02-16 DIAGNOSIS — Z01812 Encounter for preprocedural laboratory examination: Secondary | ICD-10-CM | POA: Diagnosis present

## 2019-02-16 DIAGNOSIS — Z171 Estrogen receptor negative status [ER-]: Secondary | ICD-10-CM

## 2019-02-16 DIAGNOSIS — F17210 Nicotine dependence, cigarettes, uncomplicated: Secondary | ICD-10-CM | POA: Diagnosis not present

## 2019-02-16 DIAGNOSIS — Z79899 Other long term (current) drug therapy: Secondary | ICD-10-CM | POA: Diagnosis not present

## 2019-02-16 DIAGNOSIS — Z23 Encounter for immunization: Secondary | ICD-10-CM | POA: Diagnosis not present

## 2019-02-16 DIAGNOSIS — Z7189 Other specified counseling: Secondary | ICD-10-CM

## 2019-02-16 DIAGNOSIS — Z20828 Contact with and (suspected) exposure to other viral communicable diseases: Secondary | ICD-10-CM | POA: Diagnosis not present

## 2019-02-16 DIAGNOSIS — R928 Other abnormal and inconclusive findings on diagnostic imaging of breast: Secondary | ICD-10-CM

## 2019-02-16 DIAGNOSIS — M8580 Other specified disorders of bone density and structure, unspecified site: Secondary | ICD-10-CM | POA: Diagnosis not present

## 2019-02-16 DIAGNOSIS — N6320 Unspecified lump in the left breast, unspecified quadrant: Secondary | ICD-10-CM

## 2019-02-16 DIAGNOSIS — E785 Hyperlipidemia, unspecified: Secondary | ICD-10-CM | POA: Diagnosis not present

## 2019-02-16 DIAGNOSIS — C50812 Malignant neoplasm of overlapping sites of left female breast: Secondary | ICD-10-CM | POA: Diagnosis present

## 2019-02-16 DIAGNOSIS — F419 Anxiety disorder, unspecified: Secondary | ICD-10-CM | POA: Diagnosis not present

## 2019-02-16 DIAGNOSIS — F329 Major depressive disorder, single episode, unspecified: Secondary | ICD-10-CM | POA: Diagnosis not present

## 2019-02-16 LAB — SARS CORONAVIRUS 2 (TAT 6-24 HRS): SARS Coronavirus 2: NEGATIVE

## 2019-02-16 MED ORDER — INFLUENZA VAC SPLIT QUAD 0.5 ML IM SUSY
0.5000 mL | PREFILLED_SYRINGE | Freq: Once | INTRAMUSCULAR | Status: AC
  Administered 2019-02-16: 0.5 mL via INTRAMUSCULAR
  Filled 2019-02-16: qty 0.5

## 2019-02-16 MED ORDER — INFLUENZA VAC SPLIT QUAD 0.5 ML IM SUSY: 0.5000 mL | PREFILLED_SYRINGE | Freq: Once | INTRAMUSCULAR | Status: AC

## 2019-02-16 MED ORDER — INFLUENZA VAC A&B SA ADJ QUAD 0.5 ML IM PRSY: 0.5000 mL | PREFILLED_SYRINGE | Freq: Once | INTRAMUSCULAR | Status: DC

## 2019-02-16 NOTE — Patient Instructions (Signed)
  Your procedure is scheduled on: Wednesday February 17, 2019 Report to Same Day Surgery 2nd floor [facility] Entrance-take elevator on left to 2nd floor.  Check in with surgery information desk.) To find out your arrival time, call [phone] 1:00-3:00 PM today  Remember: Instructions that are not followed completely may result in serious medical risk, up to and including death, or upon the discretion of your surgeon and anesthesiologist your surgery may need to be rescheduled.    __x__ 1. Do not eat food (including mints, candies, chewing gum) after midnight the night before your procedure. You may drink clear liquids up to 2 hours before you are scheduled to arrive at the hospital for your procedure.  Do not drink anything within 2 hours of your scheduled arrival to the hospital.  Approved clear liquids:  --Water or Apple juice without pulp  --Clear carbohydrate beverage such as Gatorade or Powerade  --Black Coffee or Clear Tea (No milk, no creamers, do not add anything to the coffee or tea)  Finish your Ensure carbohydrate drink 2 hours before arriving to the hospital.    __x__ 2. No Alcohol for 24 hours before or after surgery.   __x__ 3. No Smoking or e-cigarettes for 24 hours before surgery.  Do not use any chewable tobacco products for at least 6 hours before surgery.   __x__ 4. Notify your doctor if there is any change in your medical condition (cold, fever, infections).   __x__ 5. On the morning of surgery brush your teeth with toothpaste and water.  You may rinse your mouth with mouthwash if you wish.  Do not swallow any toothpaste or mouthwash.  Please read over the following fact sheets that you were given:   [facility] Preparing for Surgery and/or MRSA Information    __x__ Use Sage wipes as directed on instruction sheet.   Do not wear jewelry, make-up, hairpins, clips or nail polish on the day of surgery.  Do not wear lotions, powders, deodorant, or  perfumes.   Do not shave below the face/neck 48 hours prior to surgery.   Do not bring valuables to the hospital.    [facility] is not responsible for any belongings or valuables.               Contacts, dentures or bridgework may not be worn into surgery.  For patients admitted to the hospital, discharge time is determined by your treatment team.  For patients discharged on the day of surgery, you will NOT be permitted to drive yourself home.  You must have a responsible adult with you for 24 hours after surgery.  __x__ Take these medicines on the day of surgery with a small sip of water:  1. Tylenol and/or Oxycodone if needed for pain  2. Ondansetron if needed for anxiety  __x__ Follow recommendations from Cardiologist, Pulmonologist or PCP regarding stopping Aspirin, Coumadin, Plavix, Eliquis, Effient, Pradaxa, and Pletal.  __x__ TODAY: Stop Anti-inflammatories such as Meloxicam/Mobic, Advil, Ibuprofen, Motrin, Aleve, Naproxen, Naprosyn, BC/Goodies powders or aspirin products. You may continue to take Tylenol and Celebrex.   __x__ TODAY: Stop supplements until after surgery. You may continue to take Vitamin D, Vitamin B, and multivitamin.

## 2019-02-16 NOTE — Telephone Encounter (Signed)
Called report   IMPRESSION: 1. Post biopsy changes including a 2 cm hematoma in the far posterolateral left breast at the site of the patient's sites of biopsy proven malignancy. No significant or suspicious enhancement is seen beyond the biopsy site. 2. No MRI evidence of malignancy on the right.  RECOMMENDATION: Recommendation stands for ultrasound-guided biopsy of the left breast mass identified at the 11 o'clock position as recommended on addendum dated 02/09/2019.  BI-RADS CATEGORY  6: Known biopsy-proven malignancy.   Electronically Signed   By: Kristopher Oppenheim M.D.   On: 02/15/2019 15:57

## 2019-02-16 NOTE — Telephone Encounter (Signed)
Called pt because I told her that I would cal her with her appts. She is having surgery on her arm tom. And she did not want to have chemo teaching this week due to this. I let her know that I called [facility] to set up the appt for 3rd breast bx and the breast office was going to call her with this. She did get the call and it is 10/26 and she knows the time. She doe have my chart but she is unable to choose Janese Banks on the list. I told her that all the nurses get the messages and she can send it to dr b who is her doctor but just out of town right now and I will get the message. I told her about chemo education for tues 10/27 9 am here in cancer center and the chemo care with NP right after and then her echo a pm. I do not want her to be late depending on when chemo class over so we will just wait and see. She has so many appts I will print them off and send to her when she has bx at [facility] and she is ok with this. She is having a bad day with all these appt.s and her arm hurts an she is going to surgery tom for the fracture

## 2019-02-16 NOTE — Telephone Encounter (Signed)
Dr. Janese Banks told pt today. I have got in touch with samantha at mammography and she will get in touch with pt to schedule appt for bx.

## 2019-02-17 ENCOUNTER — Ambulatory Visit: Payer: 59 | Admitting: Certified Registered Nurse Anesthetist

## 2019-02-17 ENCOUNTER — Ambulatory Visit: Payer: Self-pay | Admitting: Surgery

## 2019-02-17 ENCOUNTER — Encounter: Payer: Self-pay | Admitting: Orthopedic Surgery

## 2019-02-17 ENCOUNTER — Encounter: Disposition: A | Discharge status: Home / Self Care | Payer: Self-pay | Source: Ambulatory Visit | Attending: Surgery

## 2019-02-17 ENCOUNTER — Ambulatory Visit
Admit: 2019-02-17 | Discharge: 2019-02-17 | Disposition: A | Discharge status: Home / Self Care | Payer: 59 | Source: Ambulatory Visit | Attending: Surgery | Admitting: Surgery

## 2019-02-17 ENCOUNTER — Other Ambulatory Visit: Payer: Self-pay

## 2019-02-17 ENCOUNTER — Ambulatory Visit: Admission: RE | Admit: 2019-02-17 | Payer: 59 | Source: Home / Self Care | Admitting: Orthopedic Surgery

## 2019-02-17 DIAGNOSIS — F17200 Nicotine dependence, unspecified, uncomplicated: Secondary | ICD-10-CM | POA: Insufficient documentation

## 2019-02-17 DIAGNOSIS — Z7189 Other specified counseling: Secondary | ICD-10-CM | POA: Insufficient documentation

## 2019-02-17 DIAGNOSIS — S52572A Other intraarticular fracture of lower end of left radius, initial encounter for closed fracture: Secondary | ICD-10-CM | POA: Diagnosis present

## 2019-02-17 DIAGNOSIS — F419 Anxiety disorder, unspecified: Secondary | ICD-10-CM | POA: Diagnosis not present

## 2019-02-17 DIAGNOSIS — F329 Major depressive disorder, single episode, unspecified: Secondary | ICD-10-CM | POA: Diagnosis not present

## 2019-02-17 DIAGNOSIS — Z853 Personal history of malignant neoplasm of breast: Secondary | ICD-10-CM | POA: Diagnosis not present

## 2019-02-17 DIAGNOSIS — W19XXXA Unspecified fall, initial encounter: Secondary | ICD-10-CM | POA: Diagnosis not present

## 2019-02-17 HISTORY — PX: OPEN REDUCTION INTERNAL FIXATION (ORIF) DISTAL RADIAL FRACTURE: SHX5989

## 2019-02-17 SURGERY — OPEN REDUCTION INTERNAL FIXATION (ORIF) DISTAL RADIUS FRACTURE
Anesthesia: General | Site: Wrist | Laterality: Left

## 2019-02-17 MED ORDER — NEOMYCIN-POLYMYXIN B GU 40-200000 IR SOLN
Status: DC | PRN
  Administered 2019-02-17: 2 mL

## 2019-02-17 MED ORDER — ACETAMINOPHEN 10 MG/ML IV SOLN
INTRAVENOUS | Status: DC | PRN
  Administered 2019-02-17: 1000 mg via INTRAVENOUS

## 2019-02-17 MED ORDER — ENSURE PRE-SURGERY PO LIQD
296.0000 mL | Freq: Once | ORAL | Status: DC
  Filled 2019-02-17: qty 296

## 2019-02-17 MED ORDER — FENTANYL CITRATE (PF) 100 MCG/2ML IJ SOLN
25.0000 ug | INTRAMUSCULAR | Status: DC | PRN
  Administered 2019-02-17 (×5): 25 ug via INTRAVENOUS

## 2019-02-17 MED ORDER — CEFAZOLIN SODIUM-DEXTROSE 2-4 GM/100ML-% IV SOLN: 2.0000 g | INTRAVENOUS | Status: DC

## 2019-02-17 MED ORDER — FENTANYL CITRATE (PF) 100 MCG/2ML IJ SOLN
INTRAMUSCULAR | Status: AC
  Administered 2019-02-17: 25 ug via INTRAVENOUS
  Filled 2019-02-17: qty 2

## 2019-02-17 MED ORDER — OXYCODONE HCL 5 MG PO TABS
ORAL_TABLET | ORAL | Status: AC
  Administered 2019-02-17: 5 mg via ORAL
  Filled 2019-02-17: qty 1

## 2019-02-17 MED ORDER — DEXAMETHASONE SODIUM PHOSPHATE 10 MG/ML IJ SOLN
INTRAMUSCULAR | Status: DC | PRN
  Administered 2019-02-17: 10 mg via INTRAVENOUS

## 2019-02-17 MED ORDER — ONDANSETRON HCL 4 MG/2ML IJ SOLN
INTRAMUSCULAR | Status: DC | PRN
  Administered 2019-02-17: 4 mg via INTRAVENOUS

## 2019-02-17 MED ORDER — CEFAZOLIN SODIUM-DEXTROSE 2-4 GM/100ML-% IV SOLN
INTRAVENOUS | Status: AC
  Filled 2019-02-17: qty 100

## 2019-02-17 MED ORDER — BUPIVACAINE HCL (PF) 0.25 % IJ SOLN
INTRAMUSCULAR | Status: DC | PRN
  Administered 2019-02-17: 10 mL

## 2019-02-17 MED ORDER — OXYCODONE HCL 5 MG/5ML PO SOLN: 5.0000 mg | Freq: Once | ORAL | Status: AC | PRN

## 2019-02-17 MED ORDER — CHLORHEXIDINE GLUCONATE 4 % EX LIQD: 60.0000 mL | Freq: Once | CUTANEOUS | Status: DC

## 2019-02-17 MED ORDER — CELECOXIB 200 MG PO CAPS
ORAL_CAPSULE | ORAL | Status: AC
  Administered 2019-02-17: 400 mg via ORAL
  Filled 2019-02-17: qty 2

## 2019-02-17 MED ORDER — LIDOCAINE HCL (CARDIAC) PF 100 MG/5ML IV SOSY
PREFILLED_SYRINGE | INTRAVENOUS | Status: DC | PRN
  Administered 2019-02-17: 60 mg via INTRAVENOUS

## 2019-02-17 MED ORDER — CELECOXIB 200 MG PO CAPS
400.0000 mg | ORAL_CAPSULE | Freq: Once | ORAL | Status: AC
  Administered 2019-02-17: 16:00:00 400 mg via ORAL

## 2019-02-17 MED ORDER — FENTANYL CITRATE (PF) 100 MCG/2ML IJ SOLN
INTRAMUSCULAR | Status: DC | PRN
  Administered 2019-02-17 (×5): 50 ug via INTRAVENOUS

## 2019-02-17 MED ORDER — ROCURONIUM BROMIDE 100 MG/10ML IV SOLN
INTRAVENOUS | Status: DC | PRN
  Administered 2019-02-17: 40 mg via INTRAVENOUS
  Administered 2019-02-17: 30 mg via INTRAVENOUS
  Administered 2019-02-17: 20 mg via INTRAVENOUS
  Administered 2019-02-17: 10 mg via INTRAVENOUS

## 2019-02-17 MED ORDER — HYDROCODONE-ACETAMINOPHEN 5-325 MG PO TABS: 1.0000 | ORAL_TABLET | ORAL | 0 refills | Status: DC | PRN

## 2019-02-17 MED ORDER — SUGAMMADEX SODIUM 200 MG/2ML IV SOLN
INTRAVENOUS | Status: DC | PRN
  Administered 2019-02-17: 200 mg via INTRAVENOUS

## 2019-02-17 MED ORDER — FAMOTIDINE 20 MG PO TABS
ORAL_TABLET | ORAL | Status: AC
  Administered 2019-02-17: 20 mg via ORAL
  Filled 2019-02-17: qty 1

## 2019-02-17 MED ORDER — FAMOTIDINE 20 MG PO TABS
20.0000 mg | ORAL_TABLET | Freq: Once | ORAL | Status: AC
  Administered 2019-02-17: 16:00:00 20 mg via ORAL

## 2019-02-17 MED ORDER — PROPOFOL 10 MG/ML IV BOLUS
INTRAVENOUS | Status: DC | PRN
  Administered 2019-02-17: 110 mg via INTRAVENOUS

## 2019-02-17 MED ORDER — LACTATED RINGERS IV SOLN
INTRAVENOUS | Status: DC
  Administered 2019-02-17: 16:00:00 via INTRAVENOUS

## 2019-02-17 MED ORDER — OXYCODONE HCL 5 MG PO TABS
5.0000 mg | ORAL_TABLET | Freq: Once | ORAL | Status: AC | PRN
  Administered 2019-02-17: 20:00:00 5 mg via ORAL

## 2019-02-17 MED ORDER — CEFAZOLIN SODIUM-DEXTROSE 1-4 GM/50ML-% IV SOLN
INTRAVENOUS | Status: DC | PRN
  Administered 2019-02-17: 2 g via INTRAVENOUS

## 2019-02-17 MED ORDER — DEXMEDETOMIDINE HCL 200 MCG/2ML IV SOLN
INTRAVENOUS | Status: DC | PRN
  Administered 2019-02-17: 8 ug via INTRAVENOUS
  Administered 2019-02-17 (×2): 4 ug via INTRAVENOUS

## 2019-02-17 MED ORDER — MIDAZOLAM HCL 2 MG/2ML IJ SOLN
INTRAMUSCULAR | Status: DC | PRN
  Administered 2019-02-17: 2 mg via INTRAVENOUS

## 2019-02-17 SURGICAL SUPPLY — 51 items
BIT DRILL 2 FAST STEP (BIT) ×2 IMPLANT
BIT DRILL 2.5X4 QC (BIT) ×2 IMPLANT
BNDG CONFORM 2 STRL LF (GAUZE/BANDAGES/DRESSINGS) ×2 IMPLANT
BNDG ELASTIC 3X5.8 VLCR STR LF (GAUZE/BANDAGES/DRESSINGS) ×3 IMPLANT
BNDG ESMARK 4X12 TAN STRL LF (GAUZE/BANDAGES/DRESSINGS) ×3 IMPLANT
BRUSH SCRUB EZ  4% CHG (MISCELLANEOUS) ×2
BRUSH SCRUB EZ 4% CHG (MISCELLANEOUS) ×1 IMPLANT
CAST PADDING 3X4FT ST 30246 (SOFTGOODS) ×2
CLOSURE WOUND 1/4X4 (GAUZE/BANDAGES/DRESSINGS) ×1
COVER WAND RF STERILE (DRAPES) ×3 IMPLANT
CUFF TOURN SGL QUICK 18X4 (TOURNIQUET CUFF) ×3 IMPLANT
DRAPE C-ARM XRAY 36X54 (DRAPES) ×3 IMPLANT
DRAPE FLUOR MINI C-ARM 54X84 (DRAPES) ×3 IMPLANT
DRSG DERMACEA 8X12 NADH (GAUZE/BANDAGES/DRESSINGS) ×3 IMPLANT
DURAPREP 26ML APPLICATOR (WOUND CARE) ×3 IMPLANT
ELECT REM PT RETURN 9FT ADLT (ELECTROSURGICAL) ×3
ELECTRODE REM PT RTRN 9FT ADLT (ELECTROSURGICAL) ×1 IMPLANT
GAUZE SPONGE 4X4 12PLY STRL (GAUZE/BANDAGES/DRESSINGS) ×3 IMPLANT
GLOVE BIOGEL M STRL SZ7.5 (GLOVE) ×3 IMPLANT
GLOVE INDICATOR 8.0 STRL GRN (GLOVE) ×3 IMPLANT
GOWN STRL REUS W/ TWL LRG LVL3 (GOWN DISPOSABLE) ×2 IMPLANT
GOWN STRL REUS W/TWL LRG LVL3 (GOWN DISPOSABLE) ×6
KIT TURNOVER KIT A (KITS) ×3 IMPLANT
NDL FILTER BLUNT 18X1 1/2 (NEEDLE) ×1 IMPLANT
NEEDLE FILTER BLUNT 18X 1/2SAF (NEEDLE) ×2
NEEDLE FILTER BLUNT 18X1 1/2 (NEEDLE) ×1 IMPLANT
NS IRRIG 500ML POUR BTL (IV SOLUTION) ×3 IMPLANT
PACK EXTREMITY ARMC (MISCELLANEOUS) ×3 IMPLANT
PAD CAST CTTN 3X4 STRL (SOFTGOODS) ×1 IMPLANT
PADDING CAST BLEND 3X4 NS (MISCELLANEOUS) ×1 IMPLANT
PADDING CAST BLEND 3X4YD NS (MISCELLANEOUS) ×2
PADDING CAST COTTON 3X4 STRL (SOFTGOODS) ×1
PIN GUIDE .062 (PIN) ×4 IMPLANT
PLATE SHORT 24.4X51.3 LT (Plate) ×2 IMPLANT
SCREW BN 12X3.5XNS CORT TI (Screw) IMPLANT
SCREW CORT 3.5X12 (Screw) ×6 IMPLANT
SCREW PEG LOCK 2.5X16 (Peg) ×2 IMPLANT
SCREW PEG LOCK 2.5X18 (Peg) ×6 IMPLANT
SCREW PEG LOCK 2.5X20 (Peg) ×6 IMPLANT
SOL PREP PVP 2OZ (MISCELLANEOUS) ×3
SOLUTION PREP PVP 2OZ (MISCELLANEOUS) ×1 IMPLANT
SPLINT CAST 1 STEP 3X12 (MISCELLANEOUS) ×3 IMPLANT
STOCKINETTE 48X4 2 PLY STRL (GAUZE/BANDAGES/DRESSINGS) ×1 IMPLANT
STOCKINETTE STRL 4IN 9604848 (GAUZE/BANDAGES/DRESSINGS) ×3 IMPLANT
STRIP CLOSURE SKIN 1/4X4 (GAUZE/BANDAGES/DRESSINGS) ×2 IMPLANT
SUT VIC AB 2-0 SH 27 (SUTURE) ×3
SUT VIC AB 2-0 SH 27XBRD (SUTURE) ×1 IMPLANT
SUT VIC AB 4-0 FS2 27 (SUTURE) ×3 IMPLANT
SYR 5ML LL (SYRINGE) ×3 IMPLANT
WIRE Z .045 C-WIRE SPADE TIP (WIRE) ×3 IMPLANT
WIRE Z .062 C-WIRE SPADE TIP (WIRE) ×3 IMPLANT

## 2019-02-17 NOTE — Anesthesia Post-op Follow-up Note (Signed)
Anesthesia QCDR form completed.        

## 2019-02-17 NOTE — Progress Notes (Signed)
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 COMPARISON:  Previous exam(s). FINDINGS: I met with the patient and we discussed the procedure of ultrasound-guided biopsy,  including benefits and alternatives. We discussed the high likelihood of a successful procedure. We discussed the risks of the procedure, including infection, bleeding, tissue injury, clip migration, and inadequate sampling. Informed written consent was given. The usual time-out protocol was performed immediately prior to the procedure. Lesion quadrant: Upper-outer quadrant Using sterile technique and 1% Lidocaine as local anesthetic, under direct ultrasound visualization, a 14 gauge spring-loaded device was used to perform biopsy of a mass in the upper-outer left breast using an inferior approach. At the conclusion of the procedure a venous shaped  tissue marker clip was deployed into the biopsy cavity. Follow up 2 view mammogram was performed and dictated separately. IMPRESSION: Ultrasound guided biopsy of a mass in the upper-outer left breast. No apparent complications. Electronically Signed: By: Ammie Ferrier M.D. On: 02/03/2019 12:20     Assessment and plan- Patient is a 48 y.o. 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  The second 1 measuring 0.8 x 0.6 x 0.9 cm and third one measured 0.8 x 0.5 x 1 cm.  First and the third masses as mentioned in the bowel or both of the 2 o'clock position and were biopsied and consistent with ER/PR negative and HER-2/neu positive invasive mammary carcinoma.  The second mass which was at 11 o'clock position was not biopsied  MRI done on 02/15/2019 shows hematoma at the area of biopsy but there was no other non-mass enhancement seen beyond the biopsy site.  Recommendation was still to get an ultrasound-guided biopsy of the 11 o'clock position mass.  Given the patient is young with at least 2 biopsy-proven sites of invasive mammary carcinoma which is aggressive in histology being ER/PR negative and HER-2/neu positive-plan is to proceed with neoadjuvant Taxotere carboplatin Herceptin and Perjeta every 3 weeks for 6 cycles.  Discussed risks and benefits of chemotherapy including all but not limited to nausea, vomiting, low blood counts, risk of infections and hospitalization.  Risk of hair loss and peripheral neuropathy associated with Taxotere.  Risk of cardiotoxicity associated with Herceptin and Perjeta.  Treatment will be given with a curative intent.  Patient understands and agrees to proceed.  At this time the plan is for her to proceed for surgery of her forearm this  week.  She hopes to be out of the sling after surgery.  Following that patient will get port placement, chemotherapy teach and MUGA scan next week.  I will see her on 02/26/2019 to start cycle 1 of neoadjuvant TCHP chemotherapy.  I have explained this plan to patient and her mother in detail who verbalized understanding.  We will also obtain ultrasound-guided biopsy of the 11:00 breast mass which is currently scheduled for 02/22/2019.     Cancer Staging Carcinoma of overlapping sites of left breast in female, estrogen receptor negative ([place]) Staging form: Breast, AJCC 8th Edition - Clinical stage from 02/16/2019: Stage IA (cT1, cN0, cM0, G3, ER-, PR-, HER2+) - Signed by Sindy Guadeloupe, MD on 02/17/2019     Total face to face encounter time for this patient visit was 40  min. >50% of the time was  spent in counseling and coordination of care.   Visit Diagnosis 1. Carcinoma of overlapping sites of left breast in female, estrogen receptor negative ([place])   2. Goals of  care, counseling/discussion      Dr. Randa Evens, MD, MPH [facility] at [facility] [phone] 02/17/2019 9:37 AM

## 2019-02-17 NOTE — Op Note (Signed)
OPERATIVE NOTE  DATE OF SURGERY:  02/17/2019  PATIENT NAME:  ABBEE DOWDEN   DOB: April 20, 1971  MRN: [id number]  PRE-OPERATIVE DIAGNOSIS: Left distal radius fracture  POST-OPERATIVE DIAGNOSIS:  Same  PROCEDURE:  Open reduction and internal fixation of the left distal radius fracture  SURGEON:  Marciano Sequin. M.D.  ANESTHESIA: general  ESTIMATED BLOOD LOSS: Minimal  FLUIDS REPLACED: 700 mL of crystalloid  TOURNIQUET TIME: 60 minutes  DRAINS: None  IMPLANTS UTILIZED: [employer] DVRA short volar plate, 7 - 2.47mm threaded pegs, 3 - 3.81mm cortical screws  INDICATIONS FOR SURGERY: [name] is a 48 y.o. year old female who sustained a left distal radius fracture. After discussion of the risks and benefits of surgical intervention, the patient expressed understanding of the risks benefits and agree with plans for open reduction and internal fixation.   PROCEDURE IN DETAIL: The patient was brought into the operating room and after adequate general anesthesia, a tourniquet was placed on the patient's left upper arm.The left hand and arm were prepped with alcohol and Duraprep and draped in the usual sterile fashion. A "time-out" was performed as per usual protocol. The hand and forearm were exsanguinated using an Esmarch and the tourniquet was inflated to 250 mmHg. Loupe magnification was used throughout the procedure. A volar longitudinal incision was made in line with the flexor carpi radialis tendon. The FCR tendon was reflected in a radial fashion and the floor of the tendon sheath was incised. Dissection was carried down to the pronator quadratus. The pronator quadratus was incised in an L-shaped fashion and elevated off of the volar surface of the distal radius. The fracture site was visualized and the fracture was reduced. A [employer] DVRA volar plate was provisionally positioned and stabilized using K wires. Reduction of the fracture and position of the implant  were visualized in multiple planes using the FluoroScan. A 3.5 mm cortical screw was placed in the slotted position. Next, 4 fully threaded 2.5 mm threaded pegs were placed in the proximal row with position confirmed using the FluoroScan. In a similar fashion, 3 fully threaded 2.5 mm threaded pegs were placed in the distal row. Finally, the proximal portion of the plate was additionally stabilized with 2 additional 3.5 mm cortical screws. The wound was irrigated with copious amounts of normal saline with antibiotic solution. The pronator quadratus was repaired over the plate using 624THL Vicryl. Tourniquet was deflated after total tourniquet time of 60 minutes. Good hemostasis was appreciated. The subcutaneous tissue was approximated with interrupted sutures of #2-0 Vicryl. The skin was closed with a running subcuticular suture of #4-0 Vicryl. Steri-Strips were applied. 0.25% Marcaine was injected along the incision site. A sterile dressing was applied followed by application of a volar splint.   The patient tolerated the procedure well and was transported to the PACU in stable condition.  Anuradha Chabot [name], M.D.

## 2019-02-17 NOTE — H&P (Signed)
The patient has been re-examined, and the chart reviewed, and there have been no interval changes to the documented history and physical.    The risks, benefits, and alternatives have been discussed at length. The patient expressed understanding of the risks benefits and agreed with plans for surgical intervention.  Albirtha Grinage P. Philamena Kramar, Jr. M.D.    

## 2019-02-17 NOTE — Anesthesia Postprocedure Evaluation (Signed)
Anesthesia Post Note  Patient: [name]  Procedure(s) Performed: OPEN REDUCTION INTERNAL FIXATION (ORIF) DISTAL RADIAL FRACTURE (Left Wrist)  Patient location during evaluation: PACU Anesthesia Type: General Level of consciousness: awake and alert Pain management: pain level controlled Vital Signs Assessment: post-procedure vital signs reviewed and stable Respiratory status: spontaneous breathing, nonlabored ventilation and respiratory function stable Cardiovascular status: blood pressure returned to baseline and stable Postop Assessment: no apparent nausea or vomiting Anesthetic complications: no     Last Vitals:  Vitals:   02/17/19 1945 02/17/19 1956  BP: (!) 132/91 132/86  Pulse: 90 86  Resp: 18 11  Temp:  36.6 C  SpO2: 95% 96%    Last Pain:  Vitals:   02/17/19 1956  TempSrc:   PainSc: [place]
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
## 2019-02-17 NOTE — Progress Notes (Signed)
START ON PATHWAY REGIMEN - Breast     A cycle is every 21 days:     Pertuzumab      Pertuzumab      Trastuzumab-xxxx      Trastuzumab-xxxx      Carboplatin      Docetaxel   **Always confirm dose/schedule in your pharmacy ordering system**  Patient Characteristics: Preoperative or Nonsurgical Candidate (Clinical Staging), Neoadjuvant Therapy followed by Surgery, Invasive Disease, Chemotherapy, HER2 Positive, ER Negative/Unknown Therapeutic Status: Preoperative or Nonsurgical Candidate (Clinical Staging) AJCC M Category: cM0 AJCC Grade: G3 Breast Surgical Plan: Neoadjuvant Therapy followed by Surgery ER Status: Negative (-) AJCC 8 Stage Grouping: IA HER2 Status: Positive (+) AJCC T Category: cT1b AJCC N Category: cN0 PR Status: Negative (-) Intent of Therapy: Curative Intent, Discussed with Patient 

## 2019-02-17 NOTE — Anesthesia Procedure Notes (Signed)
Procedure Name: Intubation Date/Time: 02/17/2019 5:53 PM Performed by: Caryl Asp, CRNA Pre-anesthesia Checklist: Patient identified, Patient being monitored, Timeout performed, Emergency Drugs available and Suction available Patient Re-evaluated:Patient Re-evaluated prior to induction Oxygen Delivery Method: Circle system utilized Preoxygenation: Pre-oxygenation with 100% oxygen Induction Type: IV induction Ventilation: Mask ventilation without difficulty Laryngoscope Size: 3 and McGraph Grade View: Grade I Tube type: Oral Tube size: 7.0 mm Number of attempts: 1 Airway Equipment and Method: Stylet Placement Confirmation: ETT inserted through vocal cords under direct vision,  positive ETCO2 and breath sounds checked- equal and bilateral Secured at: 20 cm Tube secured with: Tape Dental Injury: Teeth and Oropharynx as per pre-operative assessment  Comments: [place] per provider preference

## 2019-02-17 NOTE — Transfer of Care (Signed)
Immediate Anesthesia Transfer of Care Note  Patient: [name]  Procedure(s) Performed: OPEN REDUCTION INTERNAL FIXATION (ORIF) DISTAL RADIAL FRACTURE (Left Wrist)  Patient Location: PACU  Anesthesia Type:General  Level of Consciousness: awake, alert  and oriented  Airway & Oxygen Therapy: Patient Spontanous Breathing and Patient connected to face mask oxygen  Post-op Assessment: Report given to RN and Post -op Vital signs reviewed and stable  Post vital signs: Reviewed and stable  Last Vitals:  Vitals Value Taken Time  BP 137/93 02/17/19 1911  Temp 36.6 C 02/17/19 1911  Pulse 95 02/17/19 1914  Resp 16 02/17/19 1914  SpO2 100 % 02/17/19 1914  Vitals shown include unvalidated device data.  Last Pain:  Vitals:   02/17/19 1536  TempSrc: Temporal  PainSc: 2          Complications: No apparent anesthesia complications
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
## 2019-02-18 ENCOUNTER — Inpatient Hospital Stay: Payer: 59

## 2019-02-18 ENCOUNTER — Encounter: Payer: Self-pay | Admitting: Orthopedic Surgery

## 2019-02-18 ENCOUNTER — Inpatient Hospital Stay: Payer: 59 | Admitting: Oncology

## 2019-02-19 ENCOUNTER — Other Ambulatory Visit
Admit: 2019-02-19 | Discharge: 2019-02-19 | Disposition: A | Discharge status: Home / Self Care | Payer: 59 | Source: Ambulatory Visit | Attending: Surgery | Admitting: Surgery

## 2019-02-19 ENCOUNTER — Other Ambulatory Visit: Payer: Self-pay | Admitting: Oncology

## 2019-02-19 ENCOUNTER — Other Ambulatory Visit: Payer: Self-pay

## 2019-02-19 DIAGNOSIS — S52572A Other intraarticular fracture of lower end of left radius, initial encounter for closed fracture: Secondary | ICD-10-CM | POA: Diagnosis not present

## 2019-02-19 NOTE — Progress Notes (Signed)
Per [name], [facility] commercial is only preferring neulasta now. Orders changed from fulphila to neulasta.

## 2019-02-19 NOTE — Patient Instructions (Signed)
Your procedure is scheduled on: 02/25/2019 Thurs Report to Same Day Surgery 2nd floor medical mall [facility] Entrance-take elevator on left to 2nd floor.  Check in with surgery information desk.) To find out your arrival time please call [phone] between 1PM - 3PM on 02/24/2019 Wed  Remember: Instructions that are not followed completely may result in serious medical risk, up to and including death, or upon the discretion of your surgeon and anesthesiologist your surgery may need to be rescheduled.    _x___ 1. Do not eat food after midnight the night before your procedure. You may drink clear liquids up to 2 hours before you are scheduled to arrive at the hospital for your procedure.  Do not drink clear liquids within 2 hours of your scheduled arrival to the hospital.  Clear liquids include  --Water or Apple juice without pulp  --Clear carbohydrate beverage such as ClearFast or Gatorade  --Black Coffee or Clear Tea (No milk, no creamers, do not add anything to                  the coffee or Tea Type 1 and type 2 diabetics should only drink water.   ____Ensure clear carbohydrate drink on the way to the hospital for bariatric patients  ____Ensure clear carbohydrate drink 3 hours before surgery.   No gum chewing or hard candies.     __x__ 2. No Alcohol for 24 hours before or after surgery.   __x__3. No Smoking or e-cigarettes for 24 prior to surgery.  Do not use any chewable tobacco products for at least 6 hour prior to surgery   ____  4. Bring all medications with you on the day of surgery if instructed.    __x__ 5. Notify your doctor if there is any change in your medical condition     (cold, fever, infections).    x___6. On the morning of surgery brush your teeth with toothpaste and water.  You may rinse your mouth with mouth wash if you wish.  Do not swallow any toothpaste or mouthwash.   Do not wear jewelry, make-up, hairpins, clips or nail polish.  Do not wear lotions,  powders, or perfumes. You may wear deodorant.  Do not shave 48 hours prior to surgery. Men may shave face and neck.  Do not bring valuables to the hospital.    [facility] is not responsible for any belongings or valuables.               Contacts, dentures or bridgework may not be worn into surgery.  Leave your suitcase in the car. After surgery it may be brought to your room.  For patients admitted to the hospital, discharge time is determined by your                       treatment team.  _  Patients discharged the day of surgery will not be allowed to drive home.  You will need someone to drive you home and stay with you the night of your procedure.    Please read over the following fact sheets that you were given:   [facility] Preparing for Surgery and or MRSA Information   _x___ Take anti-hypertensive listed below, cardiac, seizure, asthma,     anti-reflux and psychiatric medicines. These include:  1. Pain medication if needed  2.  3.  4.  5.  6.  ____Fleets enema or Magnesium Citrate as directed.   _x___ Use  CHG Soap or sage wipes as directed on instruction sheet   ____ Use inhalers on the day of surgery and bring to hospital day of surgery  ____ Stop Metformin and Janumet 2 days prior to surgery.    ____ Take 1/2 of usual insulin dose the night before surgery and none on the morning     surgery.   _x___ Follow recommendations from Cardiologist, Pulmonologist or PCP regarding          stopping Aspirin, Coumadin, Plavix ,Eliquis, Effient, or Pradaxa, and Pletal.  X____Stop Anti-inflammatories such as Advil, Aleve, Ibuprofen, Motrin, Naproxen, Naprosyn, Goodies powders or aspirin products. OK to take Tylenol and                          Celebrex.   _x___ Stop supplements until after surgery.  But may continue Vitamin D, Vitamin B,       and multivitamin.   ____ Bring C-Pap to the hospital.

## 2019-02-22 ENCOUNTER — Other Ambulatory Visit
Admit: 2019-02-22 | Discharge: 2019-02-22 | Disposition: A | Discharge status: Home / Self Care | Payer: 59 | Attending: Surgery | Admitting: Surgery

## 2019-02-22 ENCOUNTER — Other Ambulatory Visit: Payer: 59

## 2019-02-22 ENCOUNTER — Ambulatory Visit
Admission: RE | Admit: 2019-02-22 | Discharge: 2019-02-22 | Disposition: A | Discharge status: Home / Self Care | Payer: 59 | Source: Ambulatory Visit | Attending: Oncology | Admitting: Oncology

## 2019-02-22 ENCOUNTER — Other Ambulatory Visit: Payer: Self-pay

## 2019-02-22 ENCOUNTER — Encounter: Payer: Self-pay | Admitting: Oncology

## 2019-02-22 DIAGNOSIS — R928 Other abnormal and inconclusive findings on diagnostic imaging of breast: Secondary | ICD-10-CM

## 2019-02-22 DIAGNOSIS — Z01812 Encounter for preprocedural laboratory examination: Secondary | ICD-10-CM | POA: Insufficient documentation

## 2019-02-22 DIAGNOSIS — N6321 Unspecified lump in the left breast, upper outer quadrant: Secondary | ICD-10-CM | POA: Diagnosis present

## 2019-02-22 DIAGNOSIS — Z20828 Contact with and (suspected) exposure to other viral communicable diseases: Secondary | ICD-10-CM | POA: Diagnosis not present

## 2019-02-22 DIAGNOSIS — N6320 Unspecified lump in the left breast, unspecified quadrant: Secondary | ICD-10-CM

## 2019-02-22 HISTORY — PX: BREAST BIOPSY: SHX20

## 2019-02-22 LAB — SARS CORONAVIRUS 2 (TAT 6-24 HRS): SARS Coronavirus 2: NEGATIVE

## 2019-02-22 NOTE — Patient Instructions (Signed)
Pertuzumab injection What is this medicine? PERTUZUMAB (per TOOZ ue mab) is a monoclonal antibody. It is used to treat breast cancer. This medicine may be used for other purposes; ask your health care provider or pharmacist if you have questions. COMMON BRAND NAME(S): PERJETA What should I tell my health care provider before I take this medicine? They need to know if you have any of these conditions:  heart disease  heart failure  high blood pressure  history of irregular heart beat  recent or ongoing radiation therapy  an unusual or allergic reaction to pertuzumab, other medicines, foods, dyes, or preservatives  pregnant or trying to get pregnant  breast-feeding How should I use this medicine? This medicine is for infusion into a vein. It is given by a health care professional in a hospital or clinic setting. Talk to your pediatrician regarding the use of this medicine in children. Special care may be needed. Overdosage: If you think you have taken too much of this medicine contact a poison control center or emergency room at once. NOTE: This medicine is only for you. Do not share this medicine with others. What if I miss a dose? It is important not to miss your dose. Call your doctor or health care professional if you are unable to keep an appointment. What may interact with this medicine? Interactions are not expected. Give your health care provider a list of all the medicines, herbs, non-prescription drugs, or dietary supplements you use. Also tell them if you smoke, drink alcohol, or use illegal drugs. Some items may interact with your medicine. This list may not describe all possible interactions. Give your health care provider a list of all the medicines, herbs, non-prescription drugs, or dietary supplements you use. Also tell them if you smoke, drink alcohol, or use illegal drugs. Some items may interact with your medicine. What should I watch for while using this  medicine? Your condition will be monitored carefully while you are receiving this medicine. Report any side effects. Continue your course of treatment even though you feel ill unless your doctor tells you to stop. Do not become pregnant while taking this medicine or for 7 months after stopping it. Women should inform their doctor if they wish to become pregnant or think they might be pregnant. Women of child-bearing potential will need to have a negative pregnancy test before starting this medicine. There is a potential for serious side effects to an unborn child. Talk to your health care professional or pharmacist for more information. Do not breast-feed an infant while taking this medicine or for 7 months after stopping it. Women must use effective birth control with this medicine. Call your doctor or health care professional for advice if you get a fever, chills or sore throat, or other symptoms of a cold or flu. Do not treat yourself. Try to avoid being around people who are sick. You may experience fever, chills, and headache during the infusion. Report any side effects during the infusion to your health care professional. What side effects may I notice from receiving this medicine? Side effects that you should report to your doctor or health care professional as soon as possible:  breathing problems  chest pain or palpitations  dizziness  feeling faint or lightheaded  fever or chills  skin rash, itching or hives  sore throat  swelling of the face, lips, or tongue  swelling of the legs or ankles  unusually weak or tired Side effects that usually do not require  medical attention (report to your doctor or health care professional if they continue or are bothersome):  diarrhea  hair loss  nausea, vomiting  tiredness This list may not describe all possible side effects. Call your doctor for medical advice about side effects. You may report side effects to FDA at  1-800-FDA-1088. Where should I keep my medicine? This drug is given in a hospital or clinic and will not be stored at home. NOTE: This sheet is a summary. It may not cover all possible information. If you have questions about this medicine, talk to your doctor, pharmacist, or health care provider.  2020 Elsevier/Gold Standard (2015-05-18 12:08:50) Trastuzumab injection for infusion What is this medicine? TRASTUZUMAB (tras TOO zoo mab) is a monoclonal antibody. It is used to treat breast cancer and stomach cancer. This medicine may be used for other purposes; ask your health care provider or pharmacist if you have questions. COMMON BRAND NAME(S): Herceptin, Herzuma, KANJINTI, Ogivri, Ontruzant, Trazimera What should I tell my health care provider before I take this medicine? They need to know if you have any of these conditions:  heart disease  heart failure  lung or breathing disease, like asthma  an unusual or allergic reaction to trastuzumab, benzyl alcohol, or other medications, foods, dyes, or preservatives  pregnant or trying to get pregnant  breast-feeding How should I use this medicine? This drug is given as an infusion into a vein. It is administered in a hospital or clinic by a specially trained health care professional. Talk to your pediatrician regarding the use of this medicine in children. This medicine is not approved for use in children. Overdosage: If you think you have taken too much of this medicine contact a poison control center or emergency room at once. NOTE: This medicine is only for you. Do not share this medicine with others. What if I miss a dose? It is important not to miss a dose. Call your doctor or health care professional if you are unable to keep an appointment. What may interact with this medicine? This medicine may interact with the following medications:  certain types of chemotherapy, such as daunorubicin, doxorubicin, epirubicin, and  idarubicin This list may not describe all possible interactions. Give your health care provider a list of all the medicines, herbs, non-prescription drugs, or dietary supplements you use. Also tell them if you smoke, drink alcohol, or use illegal drugs. Some items may interact with your medicine. What should I watch for while using this medicine? Visit your doctor for checks on your progress. Report any side effects. Continue your course of treatment even though you feel ill unless your doctor tells you to stop. Call your doctor or health care professional for advice if you get a fever, chills or sore throat, or other symptoms of a cold or flu. Do not treat yourself. Try to avoid being around people who are sick. You may experience fever, chills and shaking during your first infusion. These effects are usually mild and can be treated with other medicines. Report any side effects during the infusion to your health care professional. Fever and chills usually do not happen with later infusions. Do not become pregnant while taking this medicine or for 7 months after stopping it. Women should inform their doctor if they wish to become pregnant or think they might be pregnant. Women of child-bearing potential will need to have a negative pregnancy test before starting this medicine. There is a potential for serious side effects to an unborn   child. Talk to your health care professional or pharmacist for more information. Do not breast-feed an infant while taking this medicine or for 7 months after stopping it. Women must use effective birth control with this medicine. What side effects may I notice from receiving this medicine? Side effects that you should report to your doctor or health care professional as soon as possible:  allergic reactions like skin rash, itching or hives, swelling of the face, lips, or tongue  chest pain or palpitations  cough  dizziness  feeling faint or lightheaded,  falls  fever  general ill feeling or flu-like symptoms  signs of worsening heart failure like breathing problems; swelling in your legs and feet  unusually weak or tired Side effects that usually do not require medical attention (report to your doctor or health care professional if they continue or are bothersome):  bone pain  changes in taste  diarrhea  joint pain  nausea/vomiting  weight loss This list may not describe all possible side effects. Call your doctor for medical advice about side effects. You may report side effects to FDA at 1-800-FDA-1088. Where should I keep my medicine? This drug is given in a hospital or clinic and will not be stored at home. NOTE: This sheet is a summary. It may not cover all possible information. If you have questions about this medicine, talk to your doctor, pharmacist, or health care provider.  2020 Elsevier/Gold Standard (2016-04-09 14:37:52) Docetaxel injection What is this medicine? DOCETAXEL (doe se TAX el) is a chemotherapy drug. It targets fast dividing cells, like cancer cells, and causes these cells to die. This medicine is used to treat many types of cancers like breast cancer, certain stomach cancers, head and neck cancer, lung cancer, and prostate cancer. This medicine may be used for other purposes; ask your health care provider or pharmacist if you have questions. COMMON BRAND NAME(S): Docefrez, Taxotere What should I tell my health care provider before I take this medicine? They need to know if you have any of these conditions:  infection (especially a virus infection such as chickenpox, cold sores, or herpes)  liver disease  low blood counts, like low white cell, platelet, or red cell counts  an unusual or allergic reaction to docetaxel, polysorbate 80, other chemotherapy agents, other medicines, foods, dyes, or preservatives  pregnant or trying to get pregnant  breast-feeding How should I use this medicine? This  drug is given as an infusion into a vein. It is administered in a hospital or clinic by a specially trained health care professional. Talk to your pediatrician regarding the use of this medicine in children. Special care may be needed. Overdosage: If you think you have taken too much of this medicine contact a poison control center or emergency room at once. NOTE: This medicine is only for you. Do not share this medicine with others. What if I miss a dose? It is important not to miss your dose. Call your doctor or health care professional if you are unable to keep an appointment. What may interact with this medicine?  aprepitant  certain antibiotics like erythromycin or clarithromycin  certain antivirals for HIV or hepatitis  certain medicines for fungal infections like fluconazole, itraconazole, ketoconazole, posaconazole, or voriconazole  cimetidine  ciprofloxacin  conivaptan  cyclosporine  dronedarone  fluvoxamine  grapefruit juice  imatinib  verapamil This list may not describe all possible interactions. Give your health care provider a list of all the medicines, herbs, non-prescription drugs, or dietary  supplements you use. Also tell them if you smoke, drink alcohol, or use illegal drugs. Some items may interact with your medicine. What should I watch for while using this medicine? Your condition will be monitored carefully while you are receiving this medicine. You will need important blood work done while you are taking this medicine. Call your doctor or health care professional for advice if you get a fever, chills or sore throat, or other symptoms of a cold or flu. Do not treat yourself. This drug decreases your body's ability to fight infections. Try to avoid being around people who are sick. Some products may contain alcohol. Ask your health care professional if this medicine contains alcohol. Be sure to tell all health care professionals you are taking this medicine.  Certain medicines, like metronidazole and disulfiram, can cause an unpleasant reaction when taken with alcohol. The reaction includes flushing, headache, nausea, vomiting, sweating, and increased thirst. The reaction can last from 30 minutes to several hours. You may get drowsy or dizzy. Do not drive, use machinery, or do anything that needs mental alertness until you know how this medicine affects you. Do not stand or sit up quickly, especially if you are an older patient. This reduces the risk of dizzy or fainting spells. Alcohol may interfere with the effect of this medicine. Talk to your health care professional about your risk of cancer. You may be more at risk for certain types of cancer if you take this medicine. Do not become pregnant while taking this medicine or for 6 months after stopping it. Women should inform their doctor if they wish to become pregnant or think they might be pregnant. There is a potential for serious side effects to an unborn child. Talk to your health care professional or pharmacist for more information. Do not breast-feed an infant while taking this medicine or for 1 to 2 weeks after stopping it. Males who get this medicine must use a condom during sex with females who can get pregnant. If you get a woman pregnant, the baby could have birth defects. The baby could die before they are born. You will need to continue wearing a condom for 3 months after stopping the medicine. Tell your health care provider right away if your partner becomes pregnant while you are taking this medicine. This may interfere with the ability to father a child. You should talk to your doctor or health care professional if you are concerned about your fertility. What side effects may I notice from receiving this medicine? Side effects that you should report to your doctor or health care professional as soon as possible:  allergic reactions like skin rash, itching or hives, swelling of the face, lips,  or tongue  blurred vision  breathing problems  changes in vision  low blood counts - This drug may decrease the number of white blood cells, red blood cells and platelets. You may be at increased risk for infections and bleeding.  nausea and vomiting  pain, redness or irritation at site where injected  pain, tingling, numbness in the hands or feet  redness, blistering, peeling, or loosening of the skin, including inside the mouth  signs of decreased platelets or bleeding - bruising, pinpoint red spots on the skin, black, tarry stools, nosebleeds  signs of decreased red blood cells - unusually weak or tired, fainting spells, lightheadedness  signs of infection - fever or chills, cough, sore throat, pain or difficulty passing urine  swelling of the ankle, feet, hands  Side effects that usually do not require medical attention (report to your doctor or health care professional if they continue or are bothersome):  constipation  diarrhea  fingernail or toenail changes  hair loss  loss of appetite  mouth sores  muscle pain This list may not describe all possible side effects. Call your doctor for medical advice about side effects. You may report side effects to FDA at 1-800-FDA-1088. Where should I keep my medicine? This drug is given in a hospital or clinic and will not be stored at home. NOTE: This sheet is a summary. It may not cover all possible information. If you have questions about this medicine, talk to your doctor, pharmacist, or health care provider.  2020 Elsevier/Gold Standard (2018-06-19 12:23:11) Carboplatin injection What is this medicine? CARBOPLATIN (KAR boe pla tin) is a chemotherapy drug. It targets fast dividing cells, like cancer cells, and causes these cells to die. This medicine is used to treat ovarian cancer and many other cancers. This medicine may be used for other purposes; ask your health care provider or pharmacist if you have  questions. COMMON BRAND NAME(S): Paraplatin What should I tell my health care provider before I take this medicine? They need to know if you have any of these conditions:  blood disorders  hearing problems  kidney disease  recent or ongoing radiation therapy  an unusual or allergic reaction to carboplatin, cisplatin, other chemotherapy, other medicines, foods, dyes, or preservatives  pregnant or trying to get pregnant  breast-feeding How should I use this medicine? This drug is usually given as an infusion into a vein. It is administered in a hospital or clinic by a specially trained health care professional. Talk to your pediatrician regarding the use of this medicine in children. Special care may be needed. Overdosage: If you think you have taken too much of this medicine contact a poison control center or emergency room at once. NOTE: This medicine is only for you. Do not share this medicine with others. What if I miss a dose? It is important not to miss a dose. Call your doctor or health care professional if you are unable to keep an appointment. What may interact with this medicine?  medicines for seizures  medicines to increase blood counts like filgrastim, pegfilgrastim, sargramostim  some antibiotics like amikacin, gentamicin, neomycin, streptomycin, tobramycin  vaccines Talk to your doctor or health care professional before taking any of these medicines:  acetaminophen  aspirin  ibuprofen  ketoprofen  naproxen This list may not describe all possible interactions. Give your health care provider a list of all the medicines, herbs, non-prescription drugs, or dietary supplements you use. Also tell them if you smoke, drink alcohol, or use illegal drugs. Some items may interact with your medicine. What should I watch for while using this medicine? Your condition will be monitored carefully while you are receiving this medicine. You will need important blood work done  while you are taking this medicine. This drug may make you feel generally unwell. This is not uncommon, as chemotherapy can affect healthy cells as well as cancer cells. Report any side effects. Continue your course of treatment even though you feel ill unless your doctor tells you to stop. In some cases, you may be given additional medicines to help with side effects. Follow all directions for their use. Call your doctor or health care professional for advice if you get a fever, chills or sore throat, or other symptoms of a cold or flu. Do  not treat yourself. This drug decreases your body's ability to fight infections. Try to avoid being around people who are sick. This medicine may increase your risk to bruise or bleed. Call your doctor or health care professional if you notice any unusual bleeding. Be careful brushing and flossing your teeth or using a toothpick because you may get an infection or bleed more easily. If you have any dental work done, tell your dentist you are receiving this medicine. Avoid taking products that contain aspirin, acetaminophen, ibuprofen, naproxen, or ketoprofen unless instructed by your doctor. These medicines may hide a fever. Do not become pregnant while taking this medicine. Women should inform their doctor if they wish to become pregnant or think they might be pregnant. There is a potential for serious side effects to an unborn child. Talk to your health care professional or pharmacist for more information. Do not breast-feed an infant while taking this medicine. What side effects may I notice from receiving this medicine? Side effects that you should report to your doctor or health care professional as soon as possible:  allergic reactions like skin rash, itching or hives, swelling of the face, lips, or tongue  signs of infection - fever or chills, cough, sore throat, pain or difficulty passing urine  signs of decreased platelets or bleeding - bruising, pinpoint  red spots on the skin, black, tarry stools, nosebleeds  signs of decreased red blood cells - unusually weak or tired, fainting spells, lightheadedness  breathing problems  changes in hearing  changes in vision  chest pain  high blood pressure  low blood counts - This drug may decrease the number of white blood cells, red blood cells and platelets. You may be at increased risk for infections and bleeding.  nausea and vomiting  pain, swelling, redness or irritation at the injection site  pain, tingling, numbness in the hands or feet  problems with balance, talking, walking  trouble passing urine or change in the amount of urine Side effects that usually do not require medical attention (report to your doctor or health care professional if they continue or are bothersome):  hair loss  loss of appetite  metallic taste in the mouth or changes in taste This list may not describe all possible side effects. Call your doctor for medical advice about side effects. You may report side effects to FDA at 1-800-FDA-1088. Where should I keep my medicine? This drug is given in a hospital or clinic and will not be stored at home. NOTE: This sheet is a summary. It may not cover all possible information. If you have questions about this medicine, talk to your doctor, pharmacist, or health care provider.  2020 Elsevier/Gold Standard (2007-07-21 14:38:05)

## 2019-02-22 NOTE — Progress Notes (Signed)
Patient stated that she had a new breast biopsy, left (02-22-2019).

## 2019-02-23 ENCOUNTER — Encounter
Admission: RE | Admit: 2019-02-23 | Discharge: 2019-02-23 | Disposition: A | Discharge status: Home / Self Care | Payer: 59 | Source: Ambulatory Visit | Attending: Internal Medicine | Admitting: Internal Medicine

## 2019-02-23 ENCOUNTER — Other Ambulatory Visit: Payer: Self-pay

## 2019-02-23 ENCOUNTER — Encounter: Payer: Self-pay | Admitting: *Deleted

## 2019-02-23 ENCOUNTER — Inpatient Hospital Stay: Payer: 59

## 2019-02-23 ENCOUNTER — Inpatient Hospital Stay (HOSPITAL_BASED_OUTPATIENT_CLINIC_OR_DEPARTMENT_OTHER): Payer: 59 | Admitting: Oncology

## 2019-02-23 DIAGNOSIS — C50812 Malignant neoplasm of overlapping sites of left female breast: Secondary | ICD-10-CM | POA: Diagnosis present

## 2019-02-23 DIAGNOSIS — C50919 Malignant neoplasm of unspecified site of unspecified female breast: Secondary | ICD-10-CM

## 2019-02-23 DIAGNOSIS — Z5111 Encounter for antineoplastic chemotherapy: Secondary | ICD-10-CM | POA: Insufficient documentation

## 2019-02-23 DIAGNOSIS — Z171 Estrogen receptor negative status [ER-]: Secondary | ICD-10-CM | POA: Insufficient documentation

## 2019-02-23 MED ORDER — TECHNETIUM TC 99M-LABELED RED BLOOD CELLS IV KIT
22.0000 | PACK | Freq: Once | INTRAVENOUS | Status: AC | PRN
  Administered 2019-02-23: 21.66 via INTRAVENOUS
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
## 2019-02-23 NOTE — Research (Signed)
Met with patient [name] this morning following her chemotherapy education class to discuss potential participation in the [id number] research study since she is to receive Taxotere as part of standard care for her newly diagnosed Her2+ breast cancer. Patient expressed interest in learning more about the study. The Informed Consent Form was reviewed with patient page by page explaining why the study is being conducted, why she is a candidate to participate, what tests, forms and procedures will be required if she participates along with the time points for each. Discussed that there will be required study labs that must be drawn during her Taxotere infusion from her arm and cannot be collected from her port--a-cath. Stressed that participation is strictly voluntary and that she can withdraw at any time and for any reason without fear of penalty. Informed patient that she will not be paid for participating, but she will not be billed for any study related procedures. Informed that there may be occasion for her to need to come into the clinic for research that is not associated with a regular clinic visit, but that we will provide her with a gas card to offset the expense of her mileage to the [place]. The patient was alone during the consent discussion, but did want to take a copy of the consent form home to review and discuss with her family before making her decision. Plans made to contact [name] tomorrow for her decision, then have her come by following hr orthopedic appointment to provide written consent and complete her baseline study activities. Patient did fall recently and broke her left forearm, which is in a soft cast. Patient verified that her right hand is dominant and her breast cancer is on the left as well. Contact information given to patient for myself and Jeral Fruit, RN in the event the patient has questions about the study. Will contact patient tomorrow as discussed. Yolande Jolly, BSN, MHA, OCN 02/23/2019 12:37 PM  Spoke with patient by phone and she states she wants to participate in the S1714 clinical trial. Plans made for patient to return to clinic tomorrow morning at 10:00AM and officially sign the consent form and complete her baseline Neuropathy assessment and questionnaires. Will collect pre-treatment study labs on Monday when patient returns to clinic for her first chemotherapy infusion. Yolande Jolly, BSN, MHA, OCN 02/24/2019 12:41 PM  Patient in to the clinic for consent visit for S1714 this am by herself with Yolande Jolly, RN and myself. Protocol consent / HIPAA version dated 06/22/2018; [place] active date of 12/22/2018 for SWOG X2119 officially signed by patient after all questions were answered to her reported satisfaction. Protocol required questionnaires were completed after signed consent obtained. The patient refused optional lab study participation for the protocol. Patient was provided information related to her first study visit on Monday with explanation of all procedures required. She understands the she will have protocol required labs 10 minutes after completion of her Taxane agent and this is not an optional lab. Her neuropathy testing will be conducted prior to her first treatment with a taxane chemotherapy agent. The patient voiced her understanding of all protocol required treatments at this time. The patient knows the research nurses will meet her at 8am on Monday prior to her appointment with Dr. Janese Banks.The patient was provided with a signed copy of her protocol consent and HIPAA for her records.  Jeral Fruit, RN, BSN, OCN 02/25/2019 1103 am.

## 2019-02-24 ENCOUNTER — Telehealth: Payer: Self-pay

## 2019-02-24 LAB — SURGICAL PATHOLOGY

## 2019-02-24 NOTE — Telephone Encounter (Signed)
Patient called me last night and left voice mail stating she called [facility] and they did not have her EMLA cream nor the 2 antinausea meds.   Patient called again this am and after telling her meds have already been called into pharmacy she states she thought the EMLA cream and the antinausea meds she picked up last week was for her broke arm which I told her yesterday in chemo class EMLA cream will not help pain from broken arm it only numbs the skin.  As it turns out she already has all 3 meds but thought it was for her broke arm.  She also was telling me she has a call in to her primary MD for hemmroid cream because she is afraid of diarrhea. I told her she may not get diarrhea but she still wants to talk to her PCP.  I also referred her to [facility] for resources as she was very tearful several times in chemo class about finances.

## 2019-02-25 ENCOUNTER — Encounter
Admission: RE | Disposition: A | Discharge status: Home / Self Care | Payer: Self-pay | Source: Ambulatory Visit | Attending: Surgery

## 2019-02-25 ENCOUNTER — Ambulatory Visit
Admission: RE | Admit: 2019-02-25 | Discharge: 2019-02-25 | Disposition: A | Discharge status: Home / Self Care | Payer: 59 | Source: Ambulatory Visit | Attending: Surgery | Admitting: Surgery

## 2019-02-25 ENCOUNTER — Other Ambulatory Visit: Payer: Self-pay

## 2019-02-25 ENCOUNTER — Encounter: Payer: Self-pay | Admitting: *Deleted

## 2019-02-25 ENCOUNTER — Ambulatory Visit: Payer: 59

## 2019-02-25 ENCOUNTER — Ambulatory Visit: Payer: 59 | Admitting: Certified Registered"

## 2019-02-25 DIAGNOSIS — F17200 Nicotine dependence, unspecified, uncomplicated: Secondary | ICD-10-CM | POA: Insufficient documentation

## 2019-02-25 DIAGNOSIS — E785 Hyperlipidemia, unspecified: Secondary | ICD-10-CM | POA: Insufficient documentation

## 2019-02-25 DIAGNOSIS — Z95828 Presence of other vascular implants and grafts: Secondary | ICD-10-CM

## 2019-02-25 DIAGNOSIS — C50412 Malignant neoplasm of upper-outer quadrant of left female breast: Secondary | ICD-10-CM | POA: Insufficient documentation

## 2019-02-25 DIAGNOSIS — F419 Anxiety disorder, unspecified: Secondary | ICD-10-CM | POA: Insufficient documentation

## 2019-02-25 DIAGNOSIS — R569 Unspecified convulsions: Secondary | ICD-10-CM | POA: Diagnosis not present

## 2019-02-25 HISTORY — PX: PORTACATH PLACEMENT: SHX2246

## 2019-02-25 SURGERY — INSERTION, TUNNELED CENTRAL VENOUS DEVICE, WITH PORT
Anesthesia: General | Site: Chest | Laterality: Right

## 2019-02-25 MED ORDER — PROPOFOL 10 MG/ML IV BOLUS
INTRAVENOUS | Status: DC | PRN
  Administered 2019-02-25: 20 mg via INTRAVENOUS
  Administered 2019-02-25: 120 mg via INTRAVENOUS
  Administered 2019-02-25: 40 mg via INTRAVENOUS

## 2019-02-25 MED ORDER — IBUPROFEN 800 MG PO TABS: 800.0000 mg | ORAL_TABLET | Freq: Three times a day (TID) | ORAL | 0 refills | Status: DC | PRN

## 2019-02-25 MED ORDER — DEXAMETHASONE SODIUM PHOSPHATE 10 MG/ML IJ SOLN
INTRAMUSCULAR | Status: AC
  Filled 2019-02-25: qty 1

## 2019-02-25 MED ORDER — FENTANYL CITRATE (PF) 100 MCG/2ML IJ SOLN
INTRAMUSCULAR | Status: AC
  Filled 2019-02-25: qty 2

## 2019-02-25 MED ORDER — MIDAZOLAM HCL 2 MG/2ML IJ SOLN
INTRAMUSCULAR | Status: DC | PRN
  Administered 2019-02-25: 2 mg via INTRAVENOUS

## 2019-02-25 MED ORDER — CEFAZOLIN SODIUM-DEXTROSE 2-4 GM/100ML-% IV SOLN
2.0000 g | INTRAVENOUS | Status: AC
  Administered 2019-02-25: 2 g via INTRAVENOUS

## 2019-02-25 MED ORDER — ACETAMINOPHEN 160 MG/5ML PO SOLN
325.0000 mg | ORAL | Status: DC | PRN
  Filled 2019-02-25: qty 20.3

## 2019-02-25 MED ORDER — FENTANYL CITRATE (PF) 100 MCG/2ML IJ SOLN
INTRAMUSCULAR | Status: DC | PRN
  Administered 2019-02-25 (×2): 50 ug via INTRAVENOUS

## 2019-02-25 MED ORDER — HYDROCODONE-ACETAMINOPHEN 7.5-325 MG PO TABS
ORAL_TABLET | ORAL | Status: AC
  Filled 2019-02-25: qty 1

## 2019-02-25 MED ORDER — LACTATED RINGERS IV SOLN
INTRAVENOUS | Status: DC
  Administered 2019-02-25: 13:00:00 via INTRAVENOUS

## 2019-02-25 MED ORDER — HEPARIN SOD (PORK) LOCK FLUSH 100 UNIT/ML IV SOLN
INTRAVENOUS | Status: DC | PRN
  Administered 2019-02-25: 500 [IU]

## 2019-02-25 MED ORDER — LIDOCAINE HCL (PF) 2 % IJ SOLN
INTRAMUSCULAR | Status: AC
  Filled 2019-02-25: qty 10

## 2019-02-25 MED ORDER — MIDAZOLAM HCL 2 MG/2ML IJ SOLN
INTRAMUSCULAR | Status: AC
  Filled 2019-02-25: qty 2

## 2019-02-25 MED ORDER — HYDROCODONE-ACETAMINOPHEN 7.5-325 MG PO TABS
1.0000 | ORAL_TABLET | Freq: Once | ORAL | Status: AC | PRN
  Administered 2019-02-25: 1 via ORAL
  Filled 2019-02-25: qty 1

## 2019-02-25 MED ORDER — ONDANSETRON HCL 4 MG/2ML IJ SOLN
INTRAMUSCULAR | Status: AC
  Filled 2019-02-25: qty 2

## 2019-02-25 MED ORDER — FENTANYL CITRATE (PF) 100 MCG/2ML IJ SOLN: 25.0000 ug | INTRAMUSCULAR | Status: DC | PRN

## 2019-02-25 MED ORDER — PHENYLEPHRINE HCL (PRESSORS) 10 MG/ML IV SOLN
INTRAVENOUS | Status: DC | PRN
  Administered 2019-02-25: 100 ug via INTRAVENOUS

## 2019-02-25 MED ORDER — ONDANSETRON HCL 4 MG/2ML IJ SOLN
INTRAMUSCULAR | Status: DC | PRN
  Administered 2019-02-25: 4 mg via INTRAVENOUS

## 2019-02-25 MED ORDER — DEXAMETHASONE SODIUM PHOSPHATE 10 MG/ML IJ SOLN
INTRAMUSCULAR | Status: DC | PRN
  Administered 2019-02-25: 10 mg via INTRAVENOUS

## 2019-02-25 MED ORDER — CHLORHEXIDINE GLUCONATE CLOTH 2 % EX PADS: 6.0000 | MEDICATED_PAD | Freq: Once | CUTANEOUS | Status: DC

## 2019-02-25 MED ORDER — CEFAZOLIN SODIUM-DEXTROSE 2-4 GM/100ML-% IV SOLN
INTRAVENOUS | Status: AC
  Filled 2019-02-25: qty 100

## 2019-02-25 MED ORDER — FAMOTIDINE 20 MG PO TABS
20.0000 mg | ORAL_TABLET | Freq: Once | ORAL | Status: AC
  Administered 2019-02-25: 13:00:00 20 mg via ORAL

## 2019-02-25 MED ORDER — ACETAMINOPHEN 325 MG PO TABS: 325.0000 mg | ORAL_TABLET | ORAL | Status: DC | PRN

## 2019-02-25 MED ORDER — PROPOFOL 10 MG/ML IV BOLUS
INTRAVENOUS | Status: AC
  Filled 2019-02-25: qty 20

## 2019-02-25 MED ORDER — LIDOCAINE HCL (CARDIAC) PF 100 MG/5ML IV SOSY
PREFILLED_SYRINGE | INTRAVENOUS | Status: DC | PRN
  Administered 2019-02-25: 60 mg via INTRAVENOUS

## 2019-02-25 MED ORDER — FAMOTIDINE 20 MG PO TABS
ORAL_TABLET | ORAL | Status: AC
  Administered 2019-02-25: 20 mg via ORAL
  Filled 2019-02-25: qty 1

## 2019-02-25 MED ORDER — PROMETHAZINE HCL 25 MG/ML IJ SOLN: 6.2500 mg | INTRAMUSCULAR | Status: DC | PRN

## 2019-02-25 MED ORDER — LIDOCAINE-EPINEPHRINE 1 %-1:100000 IJ SOLN
INTRAMUSCULAR | Status: DC | PRN
  Administered 2019-02-25: 7.5 mL

## 2019-02-25 SURGICAL SUPPLY — 35 items
APL PRP STRL LF DISP 70% ISPRP (MISCELLANEOUS) ×1
BAG DECANTER FOR FLEXI CONT (MISCELLANEOUS) ×3 IMPLANT
BENZOIN TINCTURE PRP APPL 2/3 (GAUZE/BANDAGES/DRESSINGS) ×3 IMPLANT
BLADE SURG SZ11 CARB STEEL (BLADE) ×3 IMPLANT
BOOT SUTURE AID YELLOW STND (SUTURE) ×3 IMPLANT
CANISTER SUCT 1200ML W/VALVE (MISCELLANEOUS) ×3 IMPLANT
CHLORAPREP W/TINT 26 (MISCELLANEOUS) ×3 IMPLANT
CLOSURE WOUND 1/2 X4 (GAUZE/BANDAGES/DRESSINGS) ×1
COVER LIGHT HANDLE STERIS (MISCELLANEOUS) ×6 IMPLANT
COVER WAND RF STERILE (DRAPES) ×3 IMPLANT
DECANTER SPIKE VIAL GLASS SM (MISCELLANEOUS) ×3 IMPLANT
DRAPE C-ARM XRAY 36X54 (DRAPES) ×3 IMPLANT
DRSG TEGADERM 4X4.75 (GAUZE/BANDAGES/DRESSINGS) ×3 IMPLANT
ELECT CAUTERY BLADE 6.4 (BLADE) ×3 IMPLANT
ELECT REM PT RETURN 9FT ADLT (ELECTROSURGICAL) ×3
ELECTRODE REM PT RTRN 9FT ADLT (ELECTROSURGICAL) ×1 IMPLANT
GLOVE BIOGEL PI IND STRL 7.0 (GLOVE) ×1 IMPLANT
GLOVE BIOGEL PI INDICATOR 7.0 (GLOVE) ×2
GLOVE SURG SYN 6.5 ES PF (GLOVE) ×3 IMPLANT
GLOVE SURG SYN 6.5 PF PI (GLOVE) ×1 IMPLANT
GOWN STRL REUS W/ TWL LRG LVL3 (GOWN DISPOSABLE) ×2 IMPLANT
GOWN STRL REUS W/TWL LRG LVL3 (GOWN DISPOSABLE) ×6
IV NS 500ML (IV SOLUTION) ×3
IV NS 500ML BAXH (IV SOLUTION) ×1 IMPLANT
KIT PORT POWER 8FR ISP CVUE (Port) ×3 IMPLANT
KIT TURNOVER KIT A (KITS) ×3 IMPLANT
LABEL OR SOLS (LABEL) ×3 IMPLANT
PACK PORT-A-CATH (MISCELLANEOUS) ×3 IMPLANT
SPONGE VERSALON 4X4 4PLY (MISCELLANEOUS) ×3 IMPLANT
STRIP CLOSURE SKIN 1/2X4 (GAUZE/BANDAGES/DRESSINGS) ×2 IMPLANT
SUT MNCRL AB 4-0 PS2 18 (SUTURE) ×3 IMPLANT
SUT PROLENE 2 0 SH DA (SUTURE) ×3 IMPLANT
SUT VIC AB 3-0 SH 27 (SUTURE) ×2
SUT VIC AB 3-0 SH 27X BRD (SUTURE) ×1 IMPLANT
SYR 10ML LL (SYRINGE) ×3 IMPLANT

## 2019-02-25 NOTE — Discharge Instructions (Addendum)
Port placement , Care After This sheet gives you information about how to care for yourself after your procedure. Your health care provider may also give you more specific instructions. If you have problems or questions, contact your health care provider. What can I expect after the procedure? After the procedure, it is common to have:  Soreness.  Bruising.  Itching. Follow these instructions at home: site care Follow instructions from your health care provider about how to take care of your site. Make sure you:  Wash your hands with soap and water before and after you change your bandage (dressing). If soap and water are not available, use hand sanitizer.  Leave stitches (sutures), skin glue, or adhesive strips in place. These skin closures may need to stay in place for 2 weeks or longer. If adhesive strip edges start to loosen and curl up, you may trim the loose edges. Do not remove adhesive strips completely unless your health care provider tells you to do that.  If the area bleeds or bruises, apply gentle pressure for 10 minutes.  OK TO REMOVE OUTER DRESSING IN 24HRS. KEEP STERISTRIPS IN PLACE.  OK TO SHOWER IN 24HRS if OK WITH CANCER CENTER  Check your site every day for signs of infection. Check for:  Redness, swelling, or pain.  Fluid or blood.  Warmth.  Pus or a bad smell.  General instructions  Rest and then return to your normal activities as told by your health care provider.   tylenol and advil as needed for discomfort.  Please alternate between the two every four hours as needed for pain.     Use narcotics, if prescribed, only when tylenol and motrin is not enough to control pain.   325-650mg  every 8hrs to max of 3000mg /24hrs (including the 325mg  in every norco dose) for the tylenol.     Advil up to 800mg  per dose every 8hrs as needed for pain.    Keep all follow-up visits as told by your health care provider. This is important. Contact a health care  provider if:  You have redness, swelling, or pain around your site.  You have fluid or blood coming from your site.  Your site feels warm to the touch.  You have pus or a bad smell coming from your site.  You have a fever.  Your sutures, skin glue, or adhesive strips loosen or come off sooner than expected. Get help right away if:  You have bleeding that does not stop with pressure or a dressing. Summary  After the procedure, it is common to have some soreness, bruising, and itching at the site.  Follow instructions from your health care provider about how to take care of your site.  Check your site every day for signs of infection.  Contact a health care provider if you have redness, swelling, or pain around your site, or your site feels warm to the touch.  Keep all follow-up visits as told by your health care provider. This is important. This information is not intended to replace advice given to you by your health care provider. Make sure you discuss any questions you have with your health care provider. Document Released: 05/12/2015 Document Revised: 10/13/2017 Document Reviewed: 10/13/2017 Elsevier Interactive Patient Education  2019 [place]   1) The drugs that you were given will stay in your system until tomorrow so for the next 24 hours you should not:  A) Drive an automobile B) Make  any legal decisions C) Drink any alcoholic beverage   2) You may resume regular meals tomorrow.  Today it is better to start with liquids and gradually work up to solid foods.  You may eat anything you prefer, but it is better to start with liquids, then soup and crackers, and gradually work up to solid foods.   3) Please notify your doctor immediately if you have any unusual bleeding, trouble breathing, redness and pain at the surgery site, drainage, fever, or pain not relieved by medication.    4) Additional  Instructions:        Please contact your physician with any problems or Same Day Surgery at [phone], Monday through Friday 6 am to 4 pm, or [place] at [facility] number at [phone].

## 2019-02-25 NOTE — Transfer of Care (Signed)
Immediate Anesthesia Transfer of Care Note  Patient: [name]  Procedure(s) Performed: INSERTION PORT-A-CATH (Right Chest)  Patient Location: PACU  Anesthesia Type:General  Level of Consciousness: awake, alert  and oriented  Airway & Oxygen Therapy: Patient Spontanous Breathing and Patient connected to face mask oxygen  Post-op Assessment: Report given to RN and Post -op Vital signs reviewed and stable  Post vital signs: Reviewed and stable  Last Vitals:  Vitals Value Taken Time  BP    Temp    Pulse 87 02/25/19 1513  Resp 21 02/25/19 1513  SpO2 100 % 02/25/19 1513  Vitals shown include unvalidated device data.  Last Pain:  Vitals:   02/25/19 1247  TempSrc: Temporal  PainSc: 0-No pain         Complications: No apparent anesthesia complications
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
## 2019-02-25 NOTE — Anesthesia Post-op Follow-up Note (Signed)
Anesthesia QCDR form completed.        

## 2019-02-25 NOTE — Op Note (Signed)
OP NOTE  DATE OF PROCEDURE: 02/25/2019   SURGEON: Lysle Pearl  ANESTHESIA: LMA  PRE-OPERATIVE DIAGNOSIS: Breast cancer requring port for chemotherapy   POST-OPERATIVE DIAGNOSIS: same  PROCEDURE(S):  1.) Percutaneous access of right IJ vein under ultrasound guidance   2.) Insertion of tunneled right IJ central venous catheter with subcutaneous port  INTRAOPERATIVE FINDINGS: Patent easily compressible IJ vein with appropriate respiratory variations and well-secured tunneled central venous catheter with subcutaneous port at completion of the procedure, heplocked after confirming ease of draw and push  ESTIMATED BLOOD LOSS: Minimal (<20 mL)   SPECIMENS: None   IMPLANTS: 81F tunneled Bard PowerPort central venous catheter with subcutaneous port  DRAINS: None   COMPLICATIONS: None apparent   CONDITION AT COMPLETION: Hemodynamically stable, awake   DISPOSITION: PACU   INDICATION(S) FOR PROCEDURE:  Patient is a 48 y.o. female who presented with above diagnosis.  All risks, benefits, and alternatives to above elective procedures were discussed with the patient, who elected to proceed, and informed consent was accordingly obtained at that time.  DETAILS OF PROCEDURE:  Patient was brought to the operative suite and appropriately identified. In Trendelenburg position, right IJ venous access site was prepped and draped in the usual sterile fashion, and following a timeout, percutaneous venous access was obtained under ultrasound guidance using Seldinger technique, by which local anesthetic was injected over the area, and access needle was inserted under direct ultrasound visualization through which soft guidewire was advanced with no resistance, over which access needle was withdrawn. Guidewire was secured, attention was directed to injection of local anesthetic along the planned tunnel site, 2-3 cm transverse right chest incision was made and confirmed to accommodate the subcutaneous port, and  flushed catheter was tunneled retrograde from the port site over to the vein access site. Insertion sheath was advanced over the guidewire, which was withdrawn along with the insertion sheath dilator with no resistance. The catheter was introduced through the sheath and tip left in the [place] junction under fluoro guidance and catheter cut to appropriate length.  Catheter connected to port and placed within planned fixation site.  Fluro confirmed no kink within the entire length of the catheter at this point.  Port fixed to the pocket on two side to avoid twisting. Port was confirmed to withdraw blood and flush easily with included Heuber needle, and port heplocked. Dermis at the subcutaneous pocket was re-approximated using buried interrupted 3-0 Vicryl suture, and 4-0 Monocryl suture was used to re-approximate skin at the insertion and subcutaneous port sites in running subcuticular fashion.  Incisions then dressed with steristrips, 2x2, and tegaderm. Patient was then awakened from anesthesia and transferred to PACU in stable condition.  [place] images saved in paper chart and fluoro images saved in Epic.  CXR post op confirmed proper placement of port and no evidence of pneumothorax.

## 2019-02-25 NOTE — Anesthesia Procedure Notes (Signed)
Procedure Name: LMA Insertion Date/Time: 02/25/2019 2:03 PM Performed by: Rona Ravens, CRNA Pre-anesthesia Checklist: Patient identified, Emergency Drugs available, Suction available, Timeout performed and Patient being monitored Patient Re-evaluated:Patient Re-evaluated prior to induction Oxygen Delivery Method: Circle system utilized Preoxygenation: Pre-oxygenation with 100% oxygen Induction Type: IV induction LMA: LMA inserted LMA Size: 3.5 Number of attempts: 2 Placement Confirmation: positive ETCO2,  CO2 detector and breath sounds checked- equal and bilateral Tube secured with: Tape Dental Injury: Teeth and Oropharynx as per pre-operative assessment

## 2019-02-25 NOTE — Interval H&P Note (Signed)
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  The various methods of treatment have been discussed with the patient and family. After consideration of risks, benefits and other options for treatment, the patient has consented to  Procedure(s): INSERTION PORT-A-CATH (N/A) as a surgical intervention.  The patient's history has been reviewed, patient examined, no change in status, stable for surgery.  I have reviewed the patient's chart and labs.  Questions were answered to the patient's satisfaction.     Emett Stapel Lysle Pearl

## 2019-02-26 ENCOUNTER — Ambulatory Visit: Payer: 59

## 2019-02-26 ENCOUNTER — Other Ambulatory Visit: Payer: 59

## 2019-02-26 ENCOUNTER — Ambulatory Visit: Payer: 59 | Admitting: Oncology

## 2019-02-26 ENCOUNTER — Other Ambulatory Visit: Payer: Self-pay

## 2019-02-26 ENCOUNTER — Encounter: Payer: Self-pay | Admitting: Surgery

## 2019-02-26 NOTE — Progress Notes (Signed)
Patient stated that she had her port-a-cath inserted on 02/25/2019 by Dr. Deliah Boston from [facility].

## 2019-02-26 NOTE — Anesthesia Postprocedure Evaluation (Signed)
Anesthesia Post Note  Patient: [name]  Procedure(s) Performed: INSERTION PORT-A-CATH (Right Chest)  Patient location during evaluation: PACU Anesthesia Type: General Level of consciousness: awake and alert Pain management: pain level controlled Vital Signs Assessment: post-procedure vital signs reviewed and stable Respiratory status: spontaneous breathing, nonlabored ventilation and respiratory function stable Cardiovascular status: blood pressure returned to baseline and stable Postop Assessment: no apparent nausea or vomiting Anesthetic complications: no     Last Vitals:  Vitals:   02/25/19 1559 02/25/19 1618  BP: 124/85 127/85  Pulse: 87 89  Resp: 16 18  Temp: 36.9 C   SpO2: 97% 99%    Last Pain:  Vitals:   02/26/19 0847  TempSrc:   PainSc: [place]

## 2019-03-01 ENCOUNTER — Other Ambulatory Visit: Payer: Self-pay | Admitting: Oncology

## 2019-03-01 ENCOUNTER — Inpatient Hospital Stay (HOSPITAL_BASED_OUTPATIENT_CLINIC_OR_DEPARTMENT_OTHER): Payer: 59 | Admitting: Oncology

## 2019-03-01 ENCOUNTER — Other Ambulatory Visit: Payer: Self-pay

## 2019-03-01 ENCOUNTER — Inpatient Hospital Stay: Payer: 59 | Attending: Oncology | Admitting: *Deleted

## 2019-03-01 ENCOUNTER — Inpatient Hospital Stay: Payer: 59

## 2019-03-01 VITALS — BP 116/80 | HR 99 | Temp 97.6°F | Resp 18

## 2019-03-01 VITALS — BP 113/84 | HR 76 | Temp 97.5°F | Resp 16 | Ht 64.0 in | Wt 128.3 lb

## 2019-03-01 DIAGNOSIS — Z006 Encounter for examination for normal comparison and control in clinical research program: Secondary | ICD-10-CM

## 2019-03-01 DIAGNOSIS — Z95828 Presence of other vascular implants and grafts: Secondary | ICD-10-CM

## 2019-03-01 DIAGNOSIS — Z5111 Encounter for antineoplastic chemotherapy: Secondary | ICD-10-CM

## 2019-03-01 DIAGNOSIS — C50919 Malignant neoplasm of unspecified site of unspecified female breast: Secondary | ICD-10-CM

## 2019-03-01 DIAGNOSIS — Z79899 Other long term (current) drug therapy: Secondary | ICD-10-CM | POA: Insufficient documentation

## 2019-03-01 DIAGNOSIS — Z5112 Encounter for antineoplastic immunotherapy: Secondary | ICD-10-CM | POA: Diagnosis not present

## 2019-03-01 DIAGNOSIS — Z171 Estrogen receptor negative status [ER-]: Secondary | ICD-10-CM

## 2019-03-01 DIAGNOSIS — C50812 Malignant neoplasm of overlapping sites of left female breast: Secondary | ICD-10-CM | POA: Insufficient documentation

## 2019-03-01 LAB — CBC WITH DIFFERENTIAL/PLATELET
Abs Immature Granulocytes: 0.02 10*3/uL (ref 0.00–0.07)
Basophils Absolute: 0.1 10*3/uL (ref 0.0–0.1)
Basophils Relative: 1 %
Eosinophils Absolute: 0.1 10*3/uL (ref 0.0–0.5)
Eosinophils Relative: 1 %
HCT: 37.9 % (ref 36.0–46.0)
Hemoglobin: 12.5 g/dL (ref 12.0–15.0)
Immature Granulocytes: 0 %
Lymphocytes Relative: 25 %
Lymphs Abs: 2.5 10*3/uL (ref 0.7–4.0)
MCH: 33.7 pg (ref 26.0–34.0)
MCHC: 33 g/dL (ref 30.0–36.0)
MCV: 102.2 fL — ABNORMAL HIGH (ref 80.0–100.0)
Monocytes Absolute: 0.8 10*3/uL (ref 0.1–1.0)
Monocytes Relative: 7 %
Neutro Abs: 6.6 10*3/uL (ref 1.7–7.7)
Neutrophils Relative %: 66 %
Platelets: 394 10*3/uL (ref 150–400)
RBC: 3.71 MIL/uL — ABNORMAL LOW (ref 3.87–5.11)
RDW: 12.7 % (ref 11.5–15.5)
WBC: 10.1 10*3/uL (ref 4.0–10.5)
nRBC: 0 % (ref 0.0–0.2)

## 2019-03-01 LAB — COMPREHENSIVE METABOLIC PANEL
ALT: 12 U/L (ref 0–44)
AST: 17 U/L (ref 15–41)
Albumin: 3.6 g/dL (ref 3.5–5.0)
Alkaline Phosphatase: 50 U/L (ref 38–126)
Anion gap: 7 (ref 5–15)
BUN: 13 mg/dL (ref 6–20)
CO2: 26 mmol/L (ref 22–32)
Calcium: 8.9 mg/dL (ref 8.9–10.3)
Chloride: 105 mmol/L (ref 98–111)
Creatinine, Ser: 0.49 mg/dL (ref 0.44–1.00)
GFR calc Af Amer: >60 mL/min (ref >60)
GFR calc non Af Amer: >60 mL/min (ref >60)
Glucose, Bld: 97 mg/dL (ref 70–99)
Potassium: 4.1 mmol/L (ref 3.5–5.1)
Sodium: 138 mmol/L (ref 135–145)
Total Bilirubin: 0.4 mg/dL (ref 0.3–1.2)
Total Protein: 6.6 g/dL (ref 6.5–8.1)

## 2019-03-01 MED ORDER — SODIUM CHLORIDE 0.9 % IV SOLN
840.0000 mg | Freq: Once | INTRAVENOUS | Status: AC
  Administered 2019-03-01: 840 mg via INTRAVENOUS
  Filled 2019-03-01: qty 28

## 2019-03-01 MED ORDER — SODIUM CHLORIDE 0.9 % IV SOLN
510.0000 mg | Freq: Once | INTRAVENOUS | Status: AC
  Administered 2019-03-01: 510 mg via INTRAVENOUS
  Filled 2019-03-01: qty 51

## 2019-03-01 MED ORDER — PEGFILGRASTIM 6 MG/0.6ML ~~LOC~~ PSKT
6.0000 mg | PREFILLED_SYRINGE | Freq: Once | SUBCUTANEOUS | Status: AC
  Administered 2019-03-01: 6 mg via SUBCUTANEOUS
  Filled 2019-03-01: qty 0.6

## 2019-03-01 MED ORDER — SODIUM CHLORIDE 0.9 % IV SOLN
Freq: Once | INTRAVENOUS | Status: AC
  Administered 2019-03-01: 10:00:00 via INTRAVENOUS
  Filled 2019-03-01: qty 250

## 2019-03-01 MED ORDER — PALONOSETRON HCL INJECTION 0.25 MG/5ML
0.2500 mg | Freq: Once | INTRAVENOUS | Status: AC
  Administered 2019-03-01: 0.25 mg via INTRAVENOUS
  Filled 2019-03-01: qty 5

## 2019-03-01 MED ORDER — SODIUM CHLORIDE 0.9 % IV SOLN
75.0000 mg/m2 | Freq: Once | INTRAVENOUS | Status: AC
  Administered 2019-03-01: 120 mg via INTRAVENOUS
  Filled 2019-03-01: qty 12

## 2019-03-01 MED ORDER — DIPHENHYDRAMINE HCL 25 MG PO CAPS
50.0000 mg | ORAL_CAPSULE | Freq: Once | ORAL | Status: AC
  Administered 2019-03-01: 50 mg via ORAL
  Filled 2019-03-01: qty 2

## 2019-03-01 MED ORDER — SODIUM CHLORIDE 0.9% FLUSH
10.0000 mL | Freq: Once | INTRAVENOUS | Status: AC
  Administered 2019-03-01: 10 mL via INTRAVENOUS
  Filled 2019-03-01: qty 10

## 2019-03-01 MED ORDER — HEPARIN SOD (PORK) LOCK FLUSH 100 UNIT/ML IV SOLN
500.0000 [IU] | Freq: Once | INTRAVENOUS | Status: AC | PRN
  Administered 2019-03-01: 500 [IU]
  Filled 2019-03-01: qty 5

## 2019-03-01 MED ORDER — SODIUM CHLORIDE 0.9 % IV SOLN
Freq: Once | INTRAVENOUS | Status: AC
  Administered 2019-03-01: 10:00:00 via INTRAVENOUS
  Filled 2019-03-01: qty 5

## 2019-03-01 MED ORDER — TRASTUZUMAB-ANNS CHEMO 150 MG IV SOLR
450.0000 mg | Freq: Once | INTRAVENOUS | Status: AC
  Administered 2019-03-01: 11:00:00 450 mg via INTRAVENOUS
  Filled 2019-03-01: qty 21.43

## 2019-03-01 MED ORDER — ACETAMINOPHEN 325 MG PO TABS
650.0000 mg | ORAL_TABLET | Freq: Once | ORAL | Status: AC
  Administered 2019-03-01: 650 mg via ORAL
  Filled 2019-03-01: qty 2

## 2019-03-01 NOTE — Research (Deleted)
03/01/2019    Patient in to clinic this am with her husband for her first chemotherapy treatment with TCHP on protocol SWOG S1714 for peripheral neuropathy. States she was nervous to have her port accessed for the first time today, but that has passed now. Denies any complaints currently. Dr. Rao met with patient and husband and answered all of their questions to their reported satisfaction in regards to treatment, preventive care and safety during her chemotherapy. DCP-001 protocol consent  / hipaa version date 06/19/17 and [place] active date of 10/09/2017 were reviewed / presented in it's entirety to the patient and husband. Specific explanation of the protocol treatment, alternatives, potential risks, side effects, and potential benefits were explained in detail over 30 minutes. The patient / husbands questions were answered to their satisfaction. Patient signed the consent and hipaa at this time and will complete the worksheets during her infusion today. The S1714 neuropathy assessment was completed per protocol requirements by this research RN and Kaye Shoffner, RN. The Timed Get up and Go, fall assessment, neuropen assessment and tuning fork were all completed in successsion with the right hand and foot dominant. The patient felt every assessment point for baseline documentation. The patient knows she will have her labs drawn within 10 minutes of her Taxotere infusion today per the protocol requirements for mandatory submission, she will not have any further optional blood study labs drawn later. The S1714 Infusion Sign was taken to Lorie, RN in the infusion room. Lorie, RN was informed that a peripheral venipuncture would need to be completed at least 10 minutes prior to the Taxotere completion, she was asked to document the start and stop times and notify the research RN when time for the blood draw. The patient is currently waiting to go to infusion for treatment with her husband.   Halee Glynn,  RN, BSN, OCN 0949 am.  

## 2019-03-01 NOTE — Progress Notes (Signed)
Pt tolerated first time chemotherapy infusion well with no signs of complications or reactions. VSS throughout infusion and prior to discharge. RN educated pt on the importance of notifying the clinic if complications arise at home or call 911 if it is an emergency. Pt verbalized understanding and all questions answered at this time. Pt stable for discharge.   Talli Kimmer [employer]

## 2019-03-01 NOTE — Research (Signed)
03/01/2019    Patient in to clinic this am with her husband for her first chemotherapy treatment with [facility] on protocol SWOG [phone] for peripheral neuropathy. States she was nervous to have her port accessed for the first time today, but that has passed now. Denies any complaints currently. Dr. Janese Banks met with patient and husband and answered all of their questions to their reported satisfaction in regards to treatment, preventive care and safety during her chemotherapy. DCP-001 protocol consent  / hipaa version date 06/19/17 and [place] active date of 10/09/2017 were reviewed / presented in it's entirety to the patient and husband. Specific explanation of the protocol treatment, alternatives, potential risks, side effects, and potential benefits were explained in detail over 30 minutes. The patient / husbands questions were answered to their satisfaction. Patient signed the consent and hipaa at this time and will complete the worksheets during her infusion today. The J5701 neuropathy assessment was completed per protocol requirements by this research RN and Yolande Jolly, RN. The Timed Get up and Go, fall assessment, neuropen assessment and tuning fork were all completed in successsion with the right hand and foot dominant. The patient felt every assessment point for baseline documentation. The patient knows she will have her labs drawn within 10 minutes of her Taxotere infusion today per the protocol requirements for mandatory submission, she will not have any further optional blood study labs drawn later. The [phone] Infusion Sign was taken to Driscoll, RN in the infusion room. Lorie, RN was informed that a peripheral venipuncture would need to be completed at least 10 minutes prior to the Taxotere completion, she was asked to document the start and stop times and notify the research RN when time for the blood draw. The patient is currently waiting to go to infusion for treatment with her husband.   Jeral Fruit,  RN, BSN, [place] am.

## 2019-03-01 NOTE — Progress Notes (Signed)
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 FN, - INVASIVE MAMMARY CARCINOMA, NO SPECIAL TYPE. 4 mm in this sample. Ductal carcinoma in situ: Not identified. Lymphovascular invasion: Not identified. ER/PR/HER2: Immunohistochemistry will be performed on block B1, with [place] HER[place] 2+. Pathology results are CONCORDANT with imaging findings, per Dr. MAmmie Ferrier Pathology results were discussed with patient via telephone. The patient reported doing well after the biopsy with no adverse  symptoms, only tenderness at the site. Post biopsy care instructions were reviewed and questions were answered. The patient was encouraged to call [facility] for any additional questions or concerns. Recommendation: Surgical referral. Request for surgical referral was relayed to nurse navigators at [facility] by Electa Sniff RN  on 02/04/2019. Addendum by Electa Sniff RN on 02/05/2019. Electronically Signed   By: Ammie Ferrier M.D.   On: 02/05/2019 13:28   Result Date: 02/09/2019 CLINICAL DATA:  48 year old female presenting for ultrasound-guided biopsy of a left breast mass. EXAM: ULTRASOUND GUIDED LEFT BREAST CORE NEEDLE BIOPSY COMPARISON:  Previous exam(s). FINDINGS: I met with the patient and we discussed the procedure of ultrasound-guided biopsy, including benefits and alternatives. We discussed the high likelihood of a successful procedure. We discussed the risks of the procedure, including infection, bleeding, tissue injury, clip migration, and inadequate sampling. Informed written consent was given. The usual time-out protocol was performed immediately prior to the procedure. Lesion quadrant: Upper-outer quadrant Using sterile technique and 1% Lidocaine as local anesthetic, under direct ultrasound visualization, a 14 gauge spring-loaded device was used to perform biopsy of a mass in the upper-outer left breast using an inferior approach. At the conclusion of the procedure a venous shaped tissue marker clip was deployed into the biopsy cavity. Follow up 2 view mammogram was performed and dictated separately. IMPRESSION: Ultrasound guided biopsy of a mass in the upper-outer left breast. No apparent complications. Electronically Signed: By: Ammie Ferrier M.D. On: 02/03/2019 12:20     Assessment and plan- Patient is a 48 y.o. 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  She has also had surgery for fracture of her left forearm and is healing well from that standpoint.  Patient also underwent biopsy of the third spot at 11 o'clock position and was negative for malignancy  Counts are otherwise okay  to proceed with cycle 1 of neoadjuvant TCHP chemotherapy today.  Again discussed risk and benefits of chemotherapy including all but not limited to nausea, vomiting, low blood counts, risk of infections and hospitalizations.  Risk of hair loss and peripheral neuropathy associated with Taxotere.  Possible side effects including rash diarrhea and cardiotoxicity associated with Herceptin and Perjeta.  Patient understands and agrees to proceed as planned.    She will also be receiving on pro-Neulasta today.  Carboplatin will be given at AUC 5.  She will see Dr. Rogue Bussing in 10 days time with CBC with differential, CMP for possible IV fluids and to assess tolerance to cycle 1 of chemotherapy   Visit Diagnosis 1. Encounter for antineoplastic chemotherapy   2. Encounter for monoclonal antibody treatment for malignancy   3. Carcinoma of overlapping sites of left breast in female, estrogen receptor negative ([place])      Dr. Randa Evens, MD, MPH [facility] at [facility] [phone] 03/01/2019 3:00 PM

## 2019-03-02 ENCOUNTER — Telehealth: Payer: Self-pay

## 2019-03-02 ENCOUNTER — Inpatient Hospital Stay: Payer: 59

## 2019-03-02 NOTE — Telephone Encounter (Signed)
Telephone call to patient for follow up after receiving first chemo yesterday.   Patient states did good.  States woke up about 4 this am and felt nausea.   Took anti-nausea meds and nausea went away.   States eating good and drinking plenty of fluids.   Encouraged patient to call for any questions or concerns.

## 2019-03-05 ENCOUNTER — Encounter: Payer: Self-pay | Admitting: Internal Medicine

## 2019-03-05 NOTE — Progress Notes (Signed)
PSN spoke with patient on 01/30/19 about her financial concerns.  She stated that she is no longer working and her husband's salary does not currently cover cost of all of their monthly expenses.  Patient will receive assistance with some monthly expenses through the [place] and drug co-pay assistance through the [facility] when needed.

## 2019-03-11 ENCOUNTER — Other Ambulatory Visit: Payer: Self-pay

## 2019-03-11 ENCOUNTER — Encounter: Payer: Self-pay | Admitting: Internal Medicine

## 2019-03-11 ENCOUNTER — Inpatient Hospital Stay (HOSPITAL_BASED_OUTPATIENT_CLINIC_OR_DEPARTMENT_OTHER): Payer: 59 | Admitting: Internal Medicine

## 2019-03-11 ENCOUNTER — Inpatient Hospital Stay: Payer: 59

## 2019-03-11 ENCOUNTER — Inpatient Hospital Stay: Payer: 59 | Admitting: *Deleted

## 2019-03-11 DIAGNOSIS — C50812 Malignant neoplasm of overlapping sites of left female breast: Secondary | ICD-10-CM

## 2019-03-11 DIAGNOSIS — Z171 Estrogen receptor negative status [ER-]: Secondary | ICD-10-CM

## 2019-03-11 DIAGNOSIS — Z95828 Presence of other vascular implants and grafts: Secondary | ICD-10-CM

## 2019-03-11 DIAGNOSIS — Z5112 Encounter for antineoplastic immunotherapy: Secondary | ICD-10-CM | POA: Diagnosis not present

## 2019-03-11 LAB — COMPREHENSIVE METABOLIC PANEL
ALT: 15 U/L (ref 0–44)
AST: 24 U/L (ref 15–41)
Albumin: 3.7 g/dL (ref 3.5–5.0)
Alkaline Phosphatase: 50 U/L (ref 38–126)
Anion gap: 7 (ref 5–15)
BUN: 13 mg/dL (ref 6–20)
CO2: 28 mmol/L (ref 22–32)
Calcium: 8.9 mg/dL (ref 8.9–10.3)
Chloride: 100 mmol/L (ref 98–111)
Creatinine, Ser: 0.57 mg/dL (ref 0.44–1.00)
GFR calc Af Amer: >60 mL/min (ref >60)
GFR calc non Af Amer: >60 mL/min (ref >60)
Glucose, Bld: 103 mg/dL — ABNORMAL HIGH (ref 70–99)
Potassium: 3.4 mmol/L — ABNORMAL LOW (ref 3.5–5.1)
Sodium: 135 mmol/L (ref 135–145)
Total Bilirubin: 0.4 mg/dL (ref 0.3–1.2)
Total Protein: 6.8 g/dL (ref 6.5–8.1)

## 2019-03-11 LAB — CBC WITH DIFFERENTIAL/PLATELET
Abs Immature Granulocytes: 0.03 10*3/uL (ref 0.00–0.07)
Basophils Absolute: 0 10*3/uL (ref 0.0–0.1)
Basophils Relative: 1 %
Eosinophils Absolute: 0 10*3/uL (ref 0.0–0.5)
Eosinophils Relative: 0 %
HCT: 36.3 % (ref 36.0–46.0)
Hemoglobin: 12.3 g/dL (ref 12.0–15.0)
Immature Granulocytes: 1 %
Lymphocytes Relative: 55 %
Lymphs Abs: 2.4 10*3/uL (ref 0.7–4.0)
MCH: 33.5 pg (ref 26.0–34.0)
MCHC: 33.9 g/dL (ref 30.0–36.0)
MCV: 98.9 fL (ref 80.0–100.0)
Monocytes Absolute: 1.3 10*3/uL — ABNORMAL HIGH (ref 0.1–1.0)
Monocytes Relative: 29 %
Neutro Abs: 0.6 10*3/uL — ABNORMAL LOW (ref 1.7–7.7)
Neutrophils Relative %: 14 %
Platelets: 269 10*3/uL (ref 150–400)
RBC: 3.67 MIL/uL — ABNORMAL LOW (ref 3.87–5.11)
RDW: 11.9 % (ref 11.5–15.5)
WBC: 4.3 10*3/uL (ref 4.0–10.5)
nRBC: 0 % (ref 0.0–0.2)

## 2019-03-11 MED ORDER — SODIUM CHLORIDE 0.9% FLUSH
10.0000 mL | Freq: Once | INTRAVENOUS | Status: AC
  Administered 2019-03-11: 10 mL via INTRAVENOUS
  Filled 2019-03-11: qty 10

## 2019-03-11 MED ORDER — HEPARIN SOD (PORK) LOCK FLUSH 100 UNIT/ML IV SOLN
500.0000 [IU] | Freq: Once | INTRAVENOUS | Status: AC
  Administered 2019-03-11: 500 [IU]

## 2019-03-11 NOTE — Assessment & Plan Note (Addendum)
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  Currently on neoadjuvant chemotherapy [TCH+P]; s/p cycle #1  #Patient tolerated chemotherapy with expected side effects-see discussion below.  Discussed plan would be repeat imaging in approximately 3 cycles of chemotherapy-assess response.  Plan is to finish total 6 cycles of chemotherapy followed by surgery.  Understands will need adjuvant therapy for a total of 1 year.  # Diarrhea/cramping- G-2; from chemotherapy.  Improved on imoidum  #Fatigue grade 1-2-from chemotherapy stable.  #Musculoskeletal symptoms-secondary to growth factors-on Claritin improved.  #Long discussion with the patient and husband regarding the treatment plan.  In agreement.  # DISPOSITION:   # NO fluids today; de-accessed # Follow upon 11/23; MD-labs- cbc/cmp; TCH+P; onpro- Dr.B

## 2019-03-12 ENCOUNTER — Encounter: Payer: Self-pay | Admitting: Internal Medicine
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
## 2019-03-19 ENCOUNTER — Other Ambulatory Visit: Payer: Self-pay

## 2019-03-22 ENCOUNTER — Inpatient Hospital Stay: Payer: 59 | Admitting: Internal Medicine

## 2019-03-22 ENCOUNTER — Inpatient Hospital Stay: Payer: 59 | Admitting: *Deleted

## 2019-03-22 ENCOUNTER — Other Ambulatory Visit: Payer: Self-pay

## 2019-03-22 ENCOUNTER — Inpatient Hospital Stay: Payer: 59

## 2019-03-22 VITALS — BP 117/80 | HR 86 | Resp 18

## 2019-03-22 DIAGNOSIS — C50812 Malignant neoplasm of overlapping sites of left female breast: Secondary | ICD-10-CM | POA: Diagnosis not present

## 2019-03-22 DIAGNOSIS — Z5112 Encounter for antineoplastic immunotherapy: Secondary | ICD-10-CM | POA: Diagnosis not present

## 2019-03-22 DIAGNOSIS — Z95828 Presence of other vascular implants and grafts: Secondary | ICD-10-CM

## 2019-03-22 DIAGNOSIS — Z171 Estrogen receptor negative status [ER-]: Secondary | ICD-10-CM

## 2019-03-22 LAB — CBC WITH DIFFERENTIAL/PLATELET
Abs Immature Granulocytes: 0.03 10*3/uL (ref 0.00–0.07)
Basophils Absolute: 0.1 10*3/uL (ref 0.0–0.1)
Basophils Relative: 1 %
Eosinophils Absolute: 0 10*3/uL (ref 0.0–0.5)
Eosinophils Relative: 0 %
HCT: 39 % (ref 36.0–46.0)
Hemoglobin: 12.8 g/dL (ref 12.0–15.0)
Immature Granulocytes: 0 %
Lymphocytes Relative: 28 %
Lymphs Abs: 2.8 10*3/uL (ref 0.7–4.0)
MCH: 33.2 pg (ref 26.0–34.0)
MCHC: 32.8 g/dL (ref 30.0–36.0)
MCV: 101 fL — ABNORMAL HIGH (ref 80.0–100.0)
Monocytes Absolute: 0.8 10*3/uL (ref 0.1–1.0)
Monocytes Relative: 8 %
Neutro Abs: 6.3 10*3/uL (ref 1.7–7.7)
Neutrophils Relative %: 63 %
Platelets: 232 10*3/uL (ref 150–400)
RBC: 3.86 MIL/uL — ABNORMAL LOW (ref 3.87–5.11)
RDW: 12.9 % (ref 11.5–15.5)
WBC: 10 10*3/uL (ref 4.0–10.5)
nRBC: 0 % (ref 0.0–0.2)

## 2019-03-22 LAB — COMPREHENSIVE METABOLIC PANEL
ALT: 13 U/L (ref 0–44)
AST: 19 U/L (ref 15–41)
Albumin: 3.5 g/dL (ref 3.5–5.0)
Alkaline Phosphatase: 53 U/L (ref 38–126)
Anion gap: 6 (ref 5–15)
BUN: 10 mg/dL (ref 6–20)
CO2: 28 mmol/L (ref 22–32)
Calcium: 8.8 mg/dL — ABNORMAL LOW (ref 8.9–10.3)
Chloride: 102 mmol/L (ref 98–111)
Creatinine, Ser: 0.53 mg/dL (ref 0.44–1.00)
GFR calc Af Amer: >60 mL/min (ref >60)
GFR calc non Af Amer: >60 mL/min (ref >60)
Glucose, Bld: 80 mg/dL (ref 70–99)
Potassium: 3.5 mmol/L (ref 3.5–5.1)
Sodium: 136 mmol/L (ref 135–145)
Total Bilirubin: 0.4 mg/dL (ref 0.3–1.2)
Total Protein: 6.8 g/dL (ref 6.5–8.1)

## 2019-03-22 MED ORDER — PALONOSETRON HCL INJECTION 0.25 MG/5ML
0.2500 mg | Freq: Once | INTRAVENOUS | Status: AC
  Administered 2019-03-22: 0.25 mg via INTRAVENOUS
  Filled 2019-03-22: qty 5

## 2019-03-22 MED ORDER — SODIUM CHLORIDE 0.9 % IV SOLN
513.5000 mg | Freq: Once | INTRAVENOUS | Status: AC
  Administered 2019-03-22: 510 mg via INTRAVENOUS
  Filled 2019-03-22: qty 51

## 2019-03-22 MED ORDER — ACETAMINOPHEN 325 MG PO TABS
650.0000 mg | ORAL_TABLET | Freq: Once | ORAL | Status: AC
  Administered 2019-03-22: 650 mg via ORAL
  Filled 2019-03-22: qty 2

## 2019-03-22 MED ORDER — HEPARIN SOD (PORK) LOCK FLUSH 100 UNIT/ML IV SOLN
500.0000 [IU] | Freq: Once | INTRAVENOUS | Status: AC | PRN
  Administered 2019-03-22: 500 [IU]
  Filled 2019-03-22: qty 5

## 2019-03-22 MED ORDER — SODIUM CHLORIDE 0.9 % IV SOLN
60.0000 mg/m2 | Freq: Once | INTRAVENOUS | Status: AC
  Administered 2019-03-22: 100 mg via INTRAVENOUS
  Filled 2019-03-22: qty 10

## 2019-03-22 MED ORDER — SODIUM CHLORIDE 0.9% FLUSH
10.0000 mL | Freq: Once | INTRAVENOUS | Status: AC
  Administered 2019-03-22: 10 mL via INTRAVENOUS
  Filled 2019-03-22: qty 10

## 2019-03-22 MED ORDER — SODIUM CHLORIDE 0.9 % IV SOLN
Freq: Once | INTRAVENOUS | Status: AC
  Administered 2019-03-22: 10:00:00 via INTRAVENOUS
  Filled 2019-03-22: qty 5

## 2019-03-22 MED ORDER — SODIUM CHLORIDE 0.9 % IV SOLN
420.0000 mg | Freq: Once | INTRAVENOUS | Status: AC
  Administered 2019-03-22: 420 mg via INTRAVENOUS
  Filled 2019-03-22: qty 14

## 2019-03-22 MED ORDER — DIPHENHYDRAMINE HCL 25 MG PO CAPS
50.0000 mg | ORAL_CAPSULE | Freq: Once | ORAL | Status: AC
  Administered 2019-03-22: 50 mg via ORAL
  Filled 2019-03-22: qty 2

## 2019-03-22 MED ORDER — TRASTUZUMAB-ANNS CHEMO 150 MG IV SOLR
300.0000 mg | Freq: Once | INTRAVENOUS | Status: AC
  Administered 2019-03-22: 300 mg via INTRAVENOUS
  Filled 2019-03-22: qty 14.29

## 2019-03-22 MED ORDER — PEGFILGRASTIM 6 MG/0.6ML ~~LOC~~ PSKT
6.0000 mg | PREFILLED_SYRINGE | Freq: Once | SUBCUTANEOUS | Status: AC
  Administered 2019-03-22: 6 mg via SUBCUTANEOUS
  Filled 2019-03-22: qty 0.6

## 2019-03-22 MED ORDER — SODIUM CHLORIDE 0.9 % IV SOLN
Freq: Once | INTRAVENOUS | Status: AC
  Administered 2019-03-22: 09:00:00 via INTRAVENOUS
  Filled 2019-03-22: qty 250

## 2019-03-22 NOTE — Assessment & Plan Note (Addendum)
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  Currently on neoadjuvant chemotherapy [TCH+P]; s/p cycle #1-   # Proceed with cycle #2 today- [decrease Tax to '60mg'$ /m2]; Labs today reviewed;  acceptable for treatment today.   # Diarrhea/cramping- G-2; from chemotherapy.  Improved on imoidum/ see above.  #Fatigue grade 1-2-from chemotherapy stable.  #Musculoskeletal symptoms-secondary to growth factors-on Claritin- improved; continue claritin.   # DISPOSITION:   # chemo today # in appx 1 week- labs- cbc/bmp; possible IVFs over 1 hour # Follow upon 12/14; MD-labs- cbc/cmp; TCH+P; onpro- Dr.B
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
## 2019-03-29 ENCOUNTER — Other Ambulatory Visit: Payer: Self-pay | Admitting: *Deleted

## 2019-03-29 ENCOUNTER — Other Ambulatory Visit: Payer: Self-pay | Admitting: Internal Medicine

## 2019-03-29 ENCOUNTER — Inpatient Hospital Stay: Payer: 59

## 2019-03-29 ENCOUNTER — Inpatient Hospital Stay: Payer: 59 | Admitting: *Deleted

## 2019-03-29 ENCOUNTER — Other Ambulatory Visit: Payer: Self-pay

## 2019-03-29 DIAGNOSIS — E876 Hypokalemia: Secondary | ICD-10-CM

## 2019-03-29 DIAGNOSIS — Z5112 Encounter for antineoplastic immunotherapy: Secondary | ICD-10-CM | POA: Diagnosis not present

## 2019-03-29 DIAGNOSIS — Z95828 Presence of other vascular implants and grafts: Secondary | ICD-10-CM

## 2019-03-29 DIAGNOSIS — C50812 Malignant neoplasm of overlapping sites of left female breast: Secondary | ICD-10-CM

## 2019-03-29 DIAGNOSIS — Z171 Estrogen receptor negative status [ER-]: Secondary | ICD-10-CM

## 2019-03-29 DIAGNOSIS — D500 Iron deficiency anemia secondary to blood loss (chronic): Secondary | ICD-10-CM

## 2019-03-29 LAB — CBC WITH DIFFERENTIAL/PLATELET
Abs Immature Granulocytes: 0.39 10*3/uL — ABNORMAL HIGH (ref 0.00–0.07)
Basophils Absolute: 0 10*3/uL (ref 0.0–0.1)
Basophils Relative: 0 %
Eosinophils Absolute: 0 10*3/uL (ref 0.0–0.5)
Eosinophils Relative: 0 %
HCT: 40 % (ref 36.0–46.0)
Hemoglobin: 13.4 g/dL (ref 12.0–15.0)
Immature Granulocytes: 2 %
Lymphocytes Relative: 20 %
Lymphs Abs: 3.3 10*3/uL (ref 0.7–4.0)
MCH: 33.5 pg (ref 26.0–34.0)
MCHC: 33.5 g/dL (ref 30.0–36.0)
MCV: 100 fL (ref 80.0–100.0)
Monocytes Absolute: 2.9 10*3/uL — ABNORMAL HIGH (ref 0.1–1.0)
Monocytes Relative: 18 %
Neutro Abs: 9.4 10*3/uL — ABNORMAL HIGH (ref 1.7–7.7)
Neutrophils Relative %: 60 %
Platelets: 185 10*3/uL (ref 150–400)
RBC Morphology: NORMAL
RBC: 4 MIL/uL (ref 3.87–5.11)
RDW: 12.7 % (ref 11.5–15.5)
Smear Review: NORMAL
WBC: 15.9 10*3/uL — ABNORMAL HIGH (ref 4.0–10.5)
nRBC: 0.1 % (ref 0.0–0.2)

## 2019-03-29 LAB — BASIC METABOLIC PANEL
Anion gap: 9 (ref 5–15)
BUN: 9 mg/dL (ref 6–20)
CO2: 27 mmol/L (ref 22–32)
Calcium: 9.4 mg/dL (ref 8.9–10.3)
Chloride: 100 mmol/L (ref 98–111)
Creatinine, Ser: 0.54 mg/dL (ref 0.44–1.00)
GFR calc Af Amer: >60 mL/min (ref >60)
GFR calc non Af Amer: >60 mL/min (ref >60)
Glucose, Bld: 143 mg/dL — ABNORMAL HIGH (ref 70–99)
Potassium: 3 mmol/L — ABNORMAL LOW (ref 3.5–5.1)
Sodium: 136 mmol/L (ref 135–145)

## 2019-03-29 MED ORDER — POTASSIUM CHLORIDE CRYS ER 20 MEQ PO TBCR: 20.0000 meq | EXTENDED_RELEASE_TABLET | Freq: Two times a day (BID) | ORAL | 0 refills | Status: DC

## 2019-03-29 MED ORDER — HEPARIN SOD (PORK) LOCK FLUSH 100 UNIT/ML IV SOLN
500.0000 [IU] | Freq: Once | INTRAVENOUS | Status: AC
  Administered 2019-03-29: 500 [IU] via INTRAVENOUS
  Filled 2019-03-29: qty 5

## 2019-03-29 MED ORDER — SODIUM CHLORIDE 0.9% FLUSH
10.0000 mL | Freq: Once | INTRAVENOUS | Status: AC
  Administered 2019-03-29: 10 mL via INTRAVENOUS
  Filled 2019-03-29: qty 10

## 2019-03-29 NOTE — Progress Notes (Signed)
[name] came in today for labs and IV fluids. Patient said she does not need IV fluids today because she is feeling well and have not had any diarrhea in a week. Dr. Rogue Bussing is aware. Instructed not to give IV fluids and send patient home. Potassium was 3.0 today, so K-DUR was called in to her pharmacy. Patient is aware and verbalizes understanding.

## 2019-04-12 ENCOUNTER — Other Ambulatory Visit: Payer: Self-pay

## 2019-04-12 ENCOUNTER — Inpatient Hospital Stay: Payer: 59

## 2019-04-12 ENCOUNTER — Inpatient Hospital Stay: Payer: 59 | Attending: Internal Medicine | Admitting: Internal Medicine

## 2019-04-12 DIAGNOSIS — Z171 Estrogen receptor negative status [ER-]: Secondary | ICD-10-CM

## 2019-04-12 DIAGNOSIS — Z5111 Encounter for antineoplastic chemotherapy: Secondary | ICD-10-CM | POA: Insufficient documentation

## 2019-04-12 DIAGNOSIS — Z006 Encounter for examination for normal comparison and control in clinical research program: Secondary | ICD-10-CM | POA: Diagnosis present

## 2019-04-12 DIAGNOSIS — Z79899 Other long term (current) drug therapy: Secondary | ICD-10-CM | POA: Insufficient documentation

## 2019-04-12 DIAGNOSIS — Z5189 Encounter for other specified aftercare: Secondary | ICD-10-CM | POA: Diagnosis not present

## 2019-04-12 DIAGNOSIS — C50812 Malignant neoplasm of overlapping sites of left female breast: Secondary | ICD-10-CM

## 2019-04-12 DIAGNOSIS — Z5112 Encounter for antineoplastic immunotherapy: Secondary | ICD-10-CM | POA: Insufficient documentation

## 2019-04-12 DIAGNOSIS — Z95828 Presence of other vascular implants and grafts: Secondary | ICD-10-CM

## 2019-04-12 LAB — CBC WITH DIFFERENTIAL/PLATELET
Abs Immature Granulocytes: 0.02 10*3/uL (ref 0.00–0.07)
Basophils Absolute: 0 10*3/uL (ref 0.0–0.1)
Basophils Relative: 0 %
Eosinophils Absolute: 0 10*3/uL (ref 0.0–0.5)
Eosinophils Relative: 0 %
HCT: 37.5 % (ref 36.0–46.0)
Hemoglobin: 12.3 g/dL (ref 12.0–15.0)
Immature Granulocytes: 0 %
Lymphocytes Relative: 25 %
Lymphs Abs: 2.4 10*3/uL (ref 0.7–4.0)
MCH: 33.6 pg (ref 26.0–34.0)
MCHC: 32.8 g/dL (ref 30.0–36.0)
MCV: 102.5 fL — ABNORMAL HIGH (ref 80.0–100.0)
Monocytes Absolute: 0.9 10*3/uL (ref 0.1–1.0)
Monocytes Relative: 10 %
Neutro Abs: 6.1 10*3/uL (ref 1.7–7.7)
Neutrophils Relative %: 65 %
Platelets: 430 10*3/uL — ABNORMAL HIGH (ref 150–400)
RBC: 3.66 MIL/uL — ABNORMAL LOW (ref 3.87–5.11)
RDW: 14.7 % (ref 11.5–15.5)
WBC: 9.5 10*3/uL (ref 4.0–10.5)
nRBC: 0 % (ref 0.0–0.2)

## 2019-04-12 LAB — COMPREHENSIVE METABOLIC PANEL
ALT: 12 U/L (ref 0–44)
AST: 20 U/L (ref 15–41)
Albumin: 3.5 g/dL (ref 3.5–5.0)
Alkaline Phosphatase: 74 U/L (ref 38–126)
Anion gap: 9 (ref 5–15)
BUN: 10 mg/dL (ref 6–20)
CO2: 24 mmol/L (ref 22–32)
Calcium: 8.9 mg/dL (ref 8.9–10.3)
Chloride: 104 mmol/L (ref 98–111)
Creatinine, Ser: 0.58 mg/dL (ref 0.44–1.00)
GFR calc Af Amer: >60 mL/min (ref >60)
GFR calc non Af Amer: >60 mL/min (ref >60)
Glucose, Bld: 106 mg/dL — ABNORMAL HIGH (ref 70–99)
Potassium: 4.1 mmol/L (ref 3.5–5.1)
Sodium: 137 mmol/L (ref 135–145)
Total Bilirubin: 0.5 mg/dL (ref 0.3–1.2)
Total Protein: 7.2 g/dL (ref 6.5–8.1)

## 2019-04-12 MED ORDER — PEGFILGRASTIM 6 MG/0.6ML ~~LOC~~ PSKT
6.0000 mg | PREFILLED_SYRINGE | Freq: Once | SUBCUTANEOUS | Status: AC
  Administered 2019-04-12: 6 mg via SUBCUTANEOUS
  Filled 2019-04-12: qty 0.6

## 2019-04-12 MED ORDER — SODIUM CHLORIDE 0.9 % IV SOLN
60.0000 mg/m2 | Freq: Once | INTRAVENOUS | Status: AC
  Administered 2019-04-12: 100 mg via INTRAVENOUS
  Filled 2019-04-12: qty 10

## 2019-04-12 MED ORDER — SODIUM CHLORIDE 0.9 % IV SOLN
Freq: Once | INTRAVENOUS | Status: AC
  Administered 2019-04-12: 10:00:00 via INTRAVENOUS
  Filled 2019-04-12: qty 5

## 2019-04-12 MED ORDER — HEPARIN SOD (PORK) LOCK FLUSH 100 UNIT/ML IV SOLN
500.0000 [IU] | Freq: Once | INTRAVENOUS | Status: AC
  Administered 2019-04-12: 500 [IU] via INTRAVENOUS
  Filled 2019-04-12: qty 5

## 2019-04-12 MED ORDER — SODIUM CHLORIDE 0.9 % IV SOLN
Freq: Once | INTRAVENOUS | Status: AC
  Administered 2019-04-12: 09:00:00 via INTRAVENOUS
  Filled 2019-04-12: qty 250

## 2019-04-12 MED ORDER — DIPHENHYDRAMINE HCL 25 MG PO CAPS
50.0000 mg | ORAL_CAPSULE | Freq: Once | ORAL | Status: AC
  Administered 2019-04-12: 50 mg via ORAL
  Filled 2019-04-12: qty 2

## 2019-04-12 MED ORDER — TRASTUZUMAB-ANNS CHEMO 150 MG IV SOLR
300.0000 mg | Freq: Once | INTRAVENOUS | Status: AC
  Administered 2019-04-12: 300 mg via INTRAVENOUS
  Filled 2019-04-12: qty 14.29

## 2019-04-12 MED ORDER — SODIUM CHLORIDE 0.9 % IV SOLN
513.5000 mg | Freq: Once | INTRAVENOUS | Status: AC
  Administered 2019-04-12: 510 mg via INTRAVENOUS
  Filled 2019-04-12: qty 51

## 2019-04-12 MED ORDER — SODIUM CHLORIDE 0.9 % IV SOLN
420.0000 mg | Freq: Once | INTRAVENOUS | Status: AC
  Administered 2019-04-12: 420 mg via INTRAVENOUS
  Filled 2019-04-12: qty 14

## 2019-04-12 MED ORDER — SODIUM CHLORIDE 0.9% FLUSH
10.0000 mL | INTRAVENOUS | Status: DC | PRN
  Administered 2019-04-12: 10 mL via INTRAVENOUS
  Filled 2019-04-12: qty 10

## 2019-04-12 MED ORDER — ACETAMINOPHEN 325 MG PO TABS
650.0000 mg | ORAL_TABLET | Freq: Once | ORAL | Status: AC
  Administered 2019-04-12: 650 mg via ORAL
  Filled 2019-04-12: qty 2

## 2019-04-12 MED ORDER — PALONOSETRON HCL INJECTION 0.25 MG/5ML
0.2500 mg | Freq: Once | INTRAVENOUS | Status: AC
  Administered 2019-04-12: 0.25 mg via INTRAVENOUS
  Filled 2019-04-12: qty 5

## 2019-04-12 NOTE — Research (Signed)
SWOG [id number] Week 4 Assessment: Patient in to cancer center for her scheduled week 4 visit for protocol to see Dr. Rogue Bussing, labs and infusion. PRO-CTCAE questionnaires were completed by the patient and returned to research RN. [id number] neuropathy assessment was completed without any difficulty prior to the patient receiving her Taxotere treatment with noted decrease in sensation to the heel with the neuropen monofilament and the neurotip. She reports decreased sensation and barely felt the neurotip pen. The tuning fork assessment was completed per protocol requirements to the right foot / leg and right arm / hand without any difficulty with the research RN and Yolande Jolly, RN assisting with timing the sensation. Patient is aware that the assessments will be completed at her next 8 week visit for treatment again. Encouraged the patient to call research RN if she has any questions or concerns prior to that visit.  Jeral Fruit, RN, BSN, OCN 04/12/2019 1120 am
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
## 2019-04-12 NOTE — Assessment & Plan Note (Addendum)
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  Currently on neoadjuvant chemotherapy [TCH+P]; s/p cycle #2-   # Proceed with cycle #3 today- [decrease Tax to '60mg'$ /m2]; Labs today reviewed;  acceptable for treatment today.   # Diarrhea/cramping- G-1; from chemotherapy.  Improved on imoidum.  # Fatigue grade 1-from chemotherapy- STABLE.   #Musculoskeletal symptoms-secondary to growth factors-on Claritin- STABLE.  # DISPOSITION:   # chemo today;  # Follow upon 12/14; MD-labs- cbc/cmp; TCH+P; onpro; Left breast diagnostic mammogram/US prior- Dr.B

## 2019-04-21 ENCOUNTER — Ambulatory Visit
Admission: RE | Admit: 2019-04-21 | Discharge: 2019-04-21 | Disposition: A | Discharge status: Home / Self Care | Payer: 59 | Source: Ambulatory Visit | Attending: Internal Medicine | Admitting: Internal Medicine

## 2019-04-21 DIAGNOSIS — C50812 Malignant neoplasm of overlapping sites of left female breast: Secondary | ICD-10-CM | POA: Diagnosis present

## 2019-04-21 DIAGNOSIS — Z171 Estrogen receptor negative status [ER-]: Secondary | ICD-10-CM | POA: Diagnosis present

## 2019-04-21 HISTORY — DX: Personal history of antineoplastic chemotherapy: Z92.21

## 2019-04-29 ENCOUNTER — Other Ambulatory Visit: Payer: Self-pay

## 2019-05-03 ENCOUNTER — Inpatient Hospital Stay (HOSPITAL_BASED_OUTPATIENT_CLINIC_OR_DEPARTMENT_OTHER): Payer: 59 | Admitting: Internal Medicine

## 2019-05-03 ENCOUNTER — Inpatient Hospital Stay: Payer: 59

## 2019-05-03 ENCOUNTER — Inpatient Hospital Stay: Payer: 59 | Attending: Internal Medicine | Admitting: *Deleted

## 2019-05-03 ENCOUNTER — Other Ambulatory Visit: Payer: Self-pay

## 2019-05-03 VITALS — BP 113/74 | HR 96 | Resp 18

## 2019-05-03 DIAGNOSIS — Z171 Estrogen receptor negative status [ER-]: Secondary | ICD-10-CM | POA: Insufficient documentation

## 2019-05-03 DIAGNOSIS — C50812 Malignant neoplasm of overlapping sites of left female breast: Secondary | ICD-10-CM | POA: Insufficient documentation

## 2019-05-03 DIAGNOSIS — Z5189 Encounter for other specified aftercare: Secondary | ICD-10-CM | POA: Diagnosis not present

## 2019-05-03 DIAGNOSIS — Z79899 Other long term (current) drug therapy: Secondary | ICD-10-CM | POA: Diagnosis not present

## 2019-05-03 DIAGNOSIS — Z5111 Encounter for antineoplastic chemotherapy: Secondary | ICD-10-CM | POA: Insufficient documentation

## 2019-05-03 DIAGNOSIS — Z95828 Presence of other vascular implants and grafts: Secondary | ICD-10-CM

## 2019-05-03 DIAGNOSIS — Z006 Encounter for examination for normal comparison and control in clinical research program: Secondary | ICD-10-CM | POA: Diagnosis present

## 2019-05-03 LAB — COMPREHENSIVE METABOLIC PANEL
ALT: 10 U/L (ref 0–44)
AST: 17 U/L (ref 15–41)
Albumin: 3.4 g/dL — ABNORMAL LOW (ref 3.5–5.0)
Alkaline Phosphatase: 64 U/L (ref 38–126)
Anion gap: 8 (ref 5–15)
BUN: 8 mg/dL (ref 6–20)
CO2: 25 mmol/L (ref 22–32)
Calcium: 8.6 mg/dL — ABNORMAL LOW (ref 8.9–10.3)
Chloride: 103 mmol/L (ref 98–111)
Creatinine, Ser: 0.55 mg/dL (ref 0.44–1.00)
GFR calc Af Amer: >60 mL/min (ref >60)
GFR calc non Af Amer: >60 mL/min (ref >60)
Glucose, Bld: 98 mg/dL (ref 70–99)
Potassium: 4 mmol/L (ref 3.5–5.1)
Sodium: 136 mmol/L (ref 135–145)
Total Bilirubin: 0.3 mg/dL (ref 0.3–1.2)
Total Protein: 6.6 g/dL (ref 6.5–8.1)

## 2019-05-03 LAB — CBC WITH DIFFERENTIAL/PLATELET
Abs Immature Granulocytes: 0.03 10*3/uL (ref 0.00–0.07)
Basophils Absolute: 0 10*3/uL (ref 0.0–0.1)
Basophils Relative: 0 %
Eosinophils Absolute: 0 10*3/uL (ref 0.0–0.5)
Eosinophils Relative: 0 %
HCT: 34.7 % — ABNORMAL LOW (ref 36.0–46.0)
Hemoglobin: 11.2 g/dL — ABNORMAL LOW (ref 12.0–15.0)
Immature Granulocytes: 0 %
Lymphocytes Relative: 24 %
Lymphs Abs: 2.3 10*3/uL (ref 0.7–4.0)
MCH: 33.9 pg (ref 26.0–34.0)
MCHC: 32.3 g/dL (ref 30.0–36.0)
MCV: 105.2 fL — ABNORMAL HIGH (ref 80.0–100.0)
Monocytes Absolute: 0.9 10*3/uL (ref 0.1–1.0)
Monocytes Relative: 9 %
Neutro Abs: 6.2 10*3/uL (ref 1.7–7.7)
Neutrophils Relative %: 67 %
Platelets: 372 10*3/uL (ref 150–400)
RBC: 3.3 MIL/uL — ABNORMAL LOW (ref 3.87–5.11)
RDW: 15.9 % — ABNORMAL HIGH (ref 11.5–15.5)
WBC: 9.4 10*3/uL (ref 4.0–10.5)
nRBC: 0 % (ref 0.0–0.2)

## 2019-05-03 MED ORDER — DIPHENHYDRAMINE HCL 25 MG PO CAPS
50.0000 mg | ORAL_CAPSULE | Freq: Once | ORAL | Status: AC
  Administered 2019-05-03: 50 mg via ORAL
  Filled 2019-05-03: qty 2

## 2019-05-03 MED ORDER — SODIUM CHLORIDE 0.9 % IV SOLN
513.5000 mg | Freq: Once | INTRAVENOUS | Status: AC
  Administered 2019-05-03: 510 mg via INTRAVENOUS
  Filled 2019-05-03: qty 51

## 2019-05-03 MED ORDER — SODIUM CHLORIDE 0.9 % IV SOLN
60.0000 mg/m2 | Freq: Once | INTRAVENOUS | Status: AC
  Administered 2019-05-03: 100 mg via INTRAVENOUS
  Filled 2019-05-03: qty 10

## 2019-05-03 MED ORDER — ACETAMINOPHEN 325 MG PO TABS
650.0000 mg | ORAL_TABLET | Freq: Once | ORAL | Status: AC
  Administered 2019-05-03: 650 mg via ORAL
  Filled 2019-05-03: qty 2

## 2019-05-03 MED ORDER — HEPARIN SOD (PORK) LOCK FLUSH 100 UNIT/ML IV SOLN
500.0000 [IU] | Freq: Once | INTRAVENOUS | Status: AC | PRN
  Administered 2019-05-03: 500 [IU]
  Filled 2019-05-03: qty 5

## 2019-05-03 MED ORDER — SODIUM CHLORIDE 0.9 % IV SOLN
Freq: Once | INTRAVENOUS | Status: AC
  Filled 2019-05-03: qty 5

## 2019-05-03 MED ORDER — SODIUM CHLORIDE 0.9% FLUSH
10.0000 mL | Freq: Once | INTRAVENOUS | Status: AC
  Administered 2019-05-03: 10 mL via INTRAVENOUS
  Filled 2019-05-03: qty 10

## 2019-05-03 MED ORDER — SODIUM CHLORIDE 0.9 % IV SOLN
Freq: Once | INTRAVENOUS | Status: AC
  Filled 2019-05-03: qty 250

## 2019-05-03 MED ORDER — TRASTUZUMAB-ANNS CHEMO 150 MG IV SOLR
340.0000 mg | Freq: Once | INTRAVENOUS | Status: AC
  Administered 2019-05-03: 340 mg via INTRAVENOUS
  Filled 2019-05-03: qty 16.19

## 2019-05-03 MED ORDER — PEGFILGRASTIM 6 MG/0.6ML ~~LOC~~ PSKT
6.0000 mg | PREFILLED_SYRINGE | Freq: Once | SUBCUTANEOUS | Status: AC
  Administered 2019-05-03: 6 mg via SUBCUTANEOUS
  Filled 2019-05-03: qty 0.6

## 2019-05-03 MED ORDER — SODIUM CHLORIDE 0.9 % IV SOLN
420.0000 mg | Freq: Once | INTRAVENOUS | Status: AC
  Administered 2019-05-03: 420 mg via INTRAVENOUS
  Filled 2019-05-03: qty 14

## 2019-05-03 MED ORDER — PALONOSETRON HCL INJECTION 0.25 MG/5ML
0.2500 mg | Freq: Once | INTRAVENOUS | Status: AC
  Administered 2019-05-03: 0.25 mg via INTRAVENOUS
  Filled 2019-05-03: qty 5

## 2019-05-03 MED ORDER — HEPARIN SOD (PORK) LOCK FLUSH 100 UNIT/ML IV SOLN
INTRAVENOUS | Status: AC
  Filled 2019-05-03: qty 5

## 2019-05-03 NOTE — Research (Addendum)
SWOG [id number] Cycle 2 Week 8 Visit: Patient in to cancer center for her scheduled Week 8 visit with Dr. Rogue Bussing. Solicited AE's reviewed by Dr. Rogue Bussing with patient, Grade 1 peripheral sensory neuropathy (numbness in her fingers). Dr. Rogue Bussing completed the Follow-Up Physician Assessment for toxicity burden related to the affects on her daily life with a score of 2 (range from 0-10, 10 is the most severe). The only other symptoms are constipation and right sided upper abdominal quadrant pain related to same. Dr. Tish Men suggests Miralax for patient with her senna she is already taking. The patient is in agreement to complete her questionnaires and neuropathy assessments in the infusion room prior to her Taxol administration per protocol. Timeline for the protocol was reviewed and the next assessment for protocol will be at her 12 week visit.   [name] [id number] 05/03/2019  Adverse Event Log  Study/Protocol: SWOG [id number] Cycle: 3 , Week 8  Event Grade Onset Date Resolved Date Drug Name Attribution Treatment Comments  Sensory Peripheral Neuropathy 1 05/03/2019  Taxol Definitely    Constipation 1 05/03/2019  Taxol Possible    Pain- abdominal 1 05/03/2019   Possible

## 2019-05-03 NOTE — Assessment & Plan Note (Addendum)
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  Currently on neoadjuvant chemotherapy [TCH+P]; s/p cycle #3' US/Mammo-PR.    # Proceed with cycle #4 today- [decrease Tax to '60mg'$ /m2]; Labs today reviewed;  acceptable for treatment today.  Discussed that the plan is to proceed with total of 6 cycles of chemotherapy; followed by surgery.  Patient also need adjuvant chemotherapy post surgery.  # PN-1 finger tips-secondary Taxotere.  No interventions needed.  Discussed with clinical trials nurse  # constipation-secondary to chemotherapy.  Continue MiraLAX Dulcolax hydration.  # Fatigue grade 1-from chemotherapy-stable  # DISPOSITION:   # chemo today;  # Follow upon 12/25; MD-labs- cbc/cmp; TCH+P; onpro-- Dr.B
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
## 2019-05-21 ENCOUNTER — Other Ambulatory Visit: Payer: Self-pay

## 2019-05-21 ENCOUNTER — Encounter: Payer: Self-pay | Admitting: Internal Medicine

## 2019-05-24 ENCOUNTER — Other Ambulatory Visit: Payer: Self-pay | Admitting: *Deleted

## 2019-05-24 ENCOUNTER — Inpatient Hospital Stay: Payer: 59 | Attending: Internal Medicine | Admitting: Internal Medicine

## 2019-05-24 ENCOUNTER — Inpatient Hospital Stay: Payer: 59 | Admitting: *Deleted

## 2019-05-24 ENCOUNTER — Other Ambulatory Visit: Payer: Self-pay

## 2019-05-24 ENCOUNTER — Inpatient Hospital Stay: Payer: 59

## 2019-05-24 DIAGNOSIS — Z171 Estrogen receptor negative status [ER-]: Secondary | ICD-10-CM

## 2019-05-24 DIAGNOSIS — Z5189 Encounter for other specified aftercare: Secondary | ICD-10-CM | POA: Diagnosis not present

## 2019-05-24 DIAGNOSIS — Z5112 Encounter for antineoplastic immunotherapy: Secondary | ICD-10-CM | POA: Diagnosis not present

## 2019-05-24 DIAGNOSIS — Z95828 Presence of other vascular implants and grafts: Secondary | ICD-10-CM

## 2019-05-24 DIAGNOSIS — Z79899 Other long term (current) drug therapy: Secondary | ICD-10-CM | POA: Insufficient documentation

## 2019-05-24 DIAGNOSIS — Z5111 Encounter for antineoplastic chemotherapy: Secondary | ICD-10-CM | POA: Diagnosis present

## 2019-05-24 DIAGNOSIS — C50812 Malignant neoplasm of overlapping sites of left female breast: Secondary | ICD-10-CM

## 2019-05-24 LAB — COMPREHENSIVE METABOLIC PANEL
ALT: 10 U/L (ref 0–44)
AST: 18 U/L (ref 15–41)
Albumin: 3.2 g/dL — ABNORMAL LOW (ref 3.5–5.0)
Alkaline Phosphatase: 61 U/L (ref 38–126)
Anion gap: 8 (ref 5–15)
BUN: 10 mg/dL (ref 6–20)
CO2: 25 mmol/L (ref 22–32)
Calcium: 8.6 mg/dL — ABNORMAL LOW (ref 8.9–10.3)
Chloride: 104 mmol/L (ref 98–111)
Creatinine, Ser: 0.47 mg/dL (ref 0.44–1.00)
GFR calc Af Amer: >60 mL/min (ref >60)
GFR calc non Af Amer: >60 mL/min (ref >60)
Glucose, Bld: 92 mg/dL (ref 70–99)
Potassium: 3.7 mmol/L (ref 3.5–5.1)
Sodium: 137 mmol/L (ref 135–145)
Total Bilirubin: 0.4 mg/dL (ref 0.3–1.2)
Total Protein: 6 g/dL — ABNORMAL LOW (ref 6.5–8.1)

## 2019-05-24 LAB — CBC WITH DIFFERENTIAL/PLATELET
Abs Immature Granulocytes: 0.05 10*3/uL (ref 0.00–0.07)
Basophils Absolute: 0 10*3/uL (ref 0.0–0.1)
Basophils Relative: 1 %
Eosinophils Absolute: 0 10*3/uL (ref 0.0–0.5)
Eosinophils Relative: 0 %
HCT: 32.8 % — ABNORMAL LOW (ref 36.0–46.0)
Hemoglobin: 10.5 g/dL — ABNORMAL LOW (ref 12.0–15.0)
Immature Granulocytes: 1 %
Lymphocytes Relative: 26 %
Lymphs Abs: 2 10*3/uL (ref 0.7–4.0)
MCH: 34.7 pg — ABNORMAL HIGH (ref 26.0–34.0)
MCHC: 32 g/dL (ref 30.0–36.0)
MCV: 108.3 fL — ABNORMAL HIGH (ref 80.0–100.0)
Monocytes Absolute: 0.8 10*3/uL (ref 0.1–1.0)
Monocytes Relative: 11 %
Neutro Abs: 4.7 10*3/uL (ref 1.7–7.7)
Neutrophils Relative %: 61 %
Platelets: 294 10*3/uL (ref 150–400)
RBC: 3.03 MIL/uL — ABNORMAL LOW (ref 3.87–5.11)
RDW: 17.4 % — ABNORMAL HIGH (ref 11.5–15.5)
WBC: 7.7 10*3/uL (ref 4.0–10.5)
nRBC: 0 % (ref 0.0–0.2)

## 2019-05-24 MED ORDER — TRASTUZUMAB-ANNS CHEMO 150 MG IV SOLR
340.0000 mg | Freq: Once | INTRAVENOUS | Status: AC
  Administered 2019-05-24: 340 mg via INTRAVENOUS
  Filled 2019-05-24: qty 16.19

## 2019-05-24 MED ORDER — HEPARIN SOD (PORK) LOCK FLUSH 100 UNIT/ML IV SOLN
INTRAVENOUS | Status: AC
  Filled 2019-05-24: qty 5

## 2019-05-24 MED ORDER — SODIUM CHLORIDE 0.9 % IV SOLN
513.5000 mg | Freq: Once | INTRAVENOUS | Status: AC
  Administered 2019-05-24: 510 mg via INTRAVENOUS
  Filled 2019-05-24: qty 51

## 2019-05-24 MED ORDER — PALONOSETRON HCL INJECTION 0.25 MG/5ML
0.2500 mg | Freq: Once | INTRAVENOUS | Status: AC
  Administered 2019-05-24: 0.25 mg via INTRAVENOUS
  Filled 2019-05-24: qty 5

## 2019-05-24 MED ORDER — SODIUM CHLORIDE 0.9 % IV SOLN
60.0000 mg/m2 | Freq: Once | INTRAVENOUS | Status: AC
  Administered 2019-05-24: 100 mg via INTRAVENOUS
  Filled 2019-05-24: qty 10

## 2019-05-24 MED ORDER — ACETAMINOPHEN 325 MG PO TABS
650.0000 mg | ORAL_TABLET | Freq: Once | ORAL | Status: AC
  Administered 2019-05-24: 650 mg via ORAL
  Filled 2019-05-24: qty 2

## 2019-05-24 MED ORDER — SODIUM CHLORIDE 0.9% FLUSH
10.0000 mL | Freq: Once | INTRAVENOUS | Status: AC
  Administered 2019-05-24: 10 mL via INTRAVENOUS
  Filled 2019-05-24: qty 10

## 2019-05-24 MED ORDER — DIPHENHYDRAMINE HCL 25 MG PO CAPS
50.0000 mg | ORAL_CAPSULE | Freq: Once | ORAL | Status: AC
  Administered 2019-05-24: 50 mg via ORAL
  Filled 2019-05-24: qty 2

## 2019-05-24 MED ORDER — SODIUM CHLORIDE 0.9 % IV SOLN
Freq: Once | INTRAVENOUS | Status: AC
  Filled 2019-05-24: qty 250

## 2019-05-24 MED ORDER — HEPARIN SOD (PORK) LOCK FLUSH 100 UNIT/ML IV SOLN
500.0000 [IU] | Freq: Once | INTRAVENOUS | Status: AC | PRN
  Administered 2019-05-24: 500 [IU]
  Filled 2019-05-24: qty 5

## 2019-05-24 MED ORDER — SODIUM CHLORIDE 0.9 % IV SOLN
420.0000 mg | Freq: Once | INTRAVENOUS | Status: AC
  Administered 2019-05-24: 420 mg via INTRAVENOUS
  Filled 2019-05-24: qty 14

## 2019-05-24 MED ORDER — PEGFILGRASTIM 6 MG/0.6ML ~~LOC~~ PSKT
6.0000 mg | PREFILLED_SYRINGE | Freq: Once | SUBCUTANEOUS | Status: AC
  Administered 2019-05-24: 6 mg via SUBCUTANEOUS
  Filled 2019-05-24: qty 0.6

## 2019-05-24 MED ORDER — SODIUM CHLORIDE 0.9 % IV SOLN
Freq: Once | INTRAVENOUS | Status: AC
  Filled 2019-05-24: qty 5
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
## 2019-05-24 NOTE — Assessment & Plan Note (Addendum)
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  Currently on neoadjuvant chemotherapy [TCH+P]; s/p cycle #3' US/Mammo-PR.  STABLE.   # Proceed with cycle # 5 today- [decrease Tax to 31m/m2]; Labs today reviewed;  acceptable for treatment today.  Again reviewed the plan is to proceed with total of 6 cycles of chemotherapy; followed by surgery.will order imaging at next visit.   # PN-G-1 finger tips-secondary Taxotere. STABLE;   No interventions needed.    # Constipation-secondary to chemotherapy; G-1; stable.   # Fatigue grade 1-from chemotherapy- STABLE.   # DISPOSITION:   # chemo today;  # Followup in 3 weeks- MD-labs- cbc/cmp; TCH+P; onpro- Dr.B

## 2019-06-10 ENCOUNTER — Telehealth: Payer: Self-pay | Admitting: *Deleted

## 2019-06-10 DIAGNOSIS — Z13220 Encounter for screening for lipoid disorders: Secondary | ICD-10-CM

## 2019-06-10 NOTE — Telephone Encounter (Signed)
Patient called stating her PCP wants her to come get her Cholesterol checked at his office and she is asking if we can draw it so that she can save a trip to another facility. Please advise, she said that she has an appointment Monday with [place]

## 2019-06-11 ENCOUNTER — Other Ambulatory Visit: Payer: Self-pay | Admitting: Internal Medicine

## 2019-06-11 ENCOUNTER — Other Ambulatory Visit: Payer: Self-pay

## 2019-06-11 MED ORDER — CEPHALEXIN 500 MG PO CAPS: 500.0000 mg | ORAL_CAPSULE | Freq: Three times a day (TID) | ORAL | 0 refills | Status: DC

## 2019-06-11 NOTE — Telephone Encounter (Signed)
Added lipid panel - pt aware

## 2019-06-11 NOTE — Telephone Encounter (Signed)
Dr. B - please advise. 

## 2019-06-11 NOTE — Progress Notes (Signed)
Contacted patient f/u on her phone call re: the lipid panel that she requested to be drawn on Monday. Pt informed that Dr. Rogue Bussing is ok with ordering a lipid panel for Monday. She ws instructed to have fasting labs on Monday for lipid panel accuracy.  Patient also reports a new sebaceous cyst on her labia that is oozing and slightly painful to touch. Patient would like to see [name], NP on Monday if not resolved by her apt for a quick examination. She was instructed to apply some warm compresses compresses. Dr. Rogue Bussing informed. He would like the patient to start on Keflex 500 mg 1 tablet three times a day. Contacted the patient. She was instructed to pick up this prescription.

## 2019-06-11 NOTE — Progress Notes (Signed)
Called in script for keflex TID; given possible labial cyst infection- awaiting eval on 2/15 as planned. Also; awaiting eval with [name] on 2/15.

## 2019-06-11 NOTE — Telephone Encounter (Signed)
Ok with me 

## 2019-06-11 NOTE — Progress Notes (Signed)
Pt contacted and made aware.

## 2019-06-14 ENCOUNTER — Inpatient Hospital Stay (HOSPITAL_BASED_OUTPATIENT_CLINIC_OR_DEPARTMENT_OTHER): Payer: 59 | Admitting: Internal Medicine

## 2019-06-14 ENCOUNTER — Other Ambulatory Visit: Payer: Self-pay | Admitting: *Deleted

## 2019-06-14 ENCOUNTER — Inpatient Hospital Stay: Payer: 59

## 2019-06-14 ENCOUNTER — Encounter: Payer: Self-pay | Admitting: Internal Medicine

## 2019-06-14 ENCOUNTER — Other Ambulatory Visit: Payer: Self-pay

## 2019-06-14 ENCOUNTER — Inpatient Hospital Stay: Payer: 59 | Attending: Internal Medicine

## 2019-06-14 DIAGNOSIS — C50812 Malignant neoplasm of overlapping sites of left female breast: Secondary | ICD-10-CM | POA: Insufficient documentation

## 2019-06-14 DIAGNOSIS — N907 Vulvar cyst: Secondary | ICD-10-CM

## 2019-06-14 DIAGNOSIS — Z171 Estrogen receptor negative status [ER-]: Secondary | ICD-10-CM | POA: Diagnosis not present

## 2019-06-14 DIAGNOSIS — F17210 Nicotine dependence, cigarettes, uncomplicated: Secondary | ICD-10-CM

## 2019-06-14 DIAGNOSIS — Z13220 Encounter for screening for lipoid disorders: Secondary | ICD-10-CM

## 2019-06-14 DIAGNOSIS — Z95828 Presence of other vascular implants and grafts: Secondary | ICD-10-CM

## 2019-06-14 DIAGNOSIS — Z006 Encounter for examination for normal comparison and control in clinical research program: Secondary | ICD-10-CM

## 2019-06-14 LAB — CBC WITH DIFFERENTIAL/PLATELET
Abs Immature Granulocytes: 0.02 10*3/uL (ref 0.00–0.07)
Basophils Absolute: 0.1 10*3/uL (ref 0.0–0.1)
Basophils Relative: 1 %
Eosinophils Absolute: 0 10*3/uL (ref 0.0–0.5)
Eosinophils Relative: 0 %
HCT: 36.8 % (ref 36.0–46.0)
Hemoglobin: 11.9 g/dL — ABNORMAL LOW (ref 12.0–15.0)
Immature Granulocytes: 0 %
Lymphocytes Relative: 34 %
Lymphs Abs: 2.3 10*3/uL (ref 0.7–4.0)
MCH: 35.8 pg — ABNORMAL HIGH (ref 26.0–34.0)
MCHC: 32.3 g/dL (ref 30.0–36.0)
MCV: 110.8 fL — ABNORMAL HIGH (ref 80.0–100.0)
Monocytes Absolute: 0.7 10*3/uL (ref 0.1–1.0)
Monocytes Relative: 11 %
Neutro Abs: 3.7 10*3/uL (ref 1.7–7.7)
Neutrophils Relative %: 54 %
Platelets: 243 10*3/uL (ref 150–400)
RBC: 3.32 MIL/uL — ABNORMAL LOW (ref 3.87–5.11)
RDW: 16.7 % — ABNORMAL HIGH (ref 11.5–15.5)
WBC: 6.7 10*3/uL (ref 4.0–10.5)
nRBC: 0 % (ref 0.0–0.2)

## 2019-06-14 LAB — COMPREHENSIVE METABOLIC PANEL
ALT: 10 U/L (ref 0–44)
AST: 17 U/L (ref 15–41)
Albumin: 3.6 g/dL (ref 3.5–5.0)
Alkaline Phosphatase: 62 U/L (ref 38–126)
Anion gap: 10 (ref 5–15)
BUN: 12 mg/dL (ref 6–20)
CO2: 24 mmol/L (ref 22–32)
Calcium: 9.1 mg/dL (ref 8.9–10.3)
Chloride: 103 mmol/L (ref 98–111)
Creatinine, Ser: 0.5 mg/dL (ref 0.44–1.00)
GFR calc Af Amer: >60 mL/min (ref >60)
GFR calc non Af Amer: >60 mL/min (ref >60)
Glucose, Bld: 109 mg/dL — ABNORMAL HIGH (ref 70–99)
Potassium: 3.8 mmol/L (ref 3.5–5.1)
Sodium: 137 mmol/L (ref 135–145)
Total Bilirubin: 0.4 mg/dL (ref 0.3–1.2)
Total Protein: 6.7 g/dL (ref 6.5–8.1)

## 2019-06-14 LAB — LIPID PANEL
Cholesterol: 161 mg/dL (ref 0–200)
HDL: 38 mg/dL — ABNORMAL LOW (ref >40)
LDL Cholesterol: 76 mg/dL (ref 0–99)
Total CHOL/HDL Ratio: 4.2 RATIO
Triglycerides: 237 mg/dL — ABNORMAL HIGH (ref <150)
VLDL: 47 mg/dL — ABNORMAL HIGH (ref 0–40)

## 2019-06-14 MED ORDER — SODIUM CHLORIDE 0.9% FLUSH
10.0000 mL | Freq: Once | INTRAVENOUS | Status: AC
  Administered 2019-06-14: 08:00:00 10 mL via INTRAVENOUS
  Filled 2019-06-14: qty 10

## 2019-06-14 MED ORDER — HEPARIN SOD (PORK) LOCK FLUSH 100 UNIT/ML IV SOLN
500.0000 [IU] | Freq: Once | INTRAVENOUS | Status: AC
  Administered 2019-06-14: 500 [IU] via INTRAVENOUS
  Filled 2019-06-14: qty 5

## 2019-06-14 NOTE — Research (Signed)
SWOG [id number] Cycle 2 Week 8 Visit: Patient in to cancer center for her scheduled Week 12 visit with Dr. Rogue Bussing. Solicited AE's reviewed by Dr. Rogue Bussing with patient, Grade 1 peripheral sensory neuropathy (numbness in her fingers and toes). Dr. Rogue Bussing completed the Follow-Up Physician Assessment for toxicity burden related to the affects on her daily life with a score of 2 (range from 0-10, 10 is the most severe). The patient c/o  sebaceous cysts on her labia. She has had these in the past, and feels that they have reoccurred because of her chemotherapy treatments. [name], PA examined the patient with this RN present. Patient is on Keflex for 10 days currently.  Dr. Tish Men recommended to hold chemotherapy until this Friday to see if the antibiotics will continue working, with a possible I & D then if not. She states that she is having some cognitive changes recently for which her husband is having to help her with her finances and other things around the house. Encouraged patient that she is having normal side effects in relation to her chemotherapy and cognitive changes happen frequently but usually improve or go away after completion. Encouraged her to let Dr. Rogue Bussing know so he can monitor this  moving forward. The patient was tearful and crying today because she had to hold her treatment. She states she's just ready to get it finished, and this would be her last treatment. Therapeutic communication provided to the patient at this time prior to her leaving the office. The patient is in agreement to complete her questionnaires and neuropathy assessments today even though she won't be getting her treatment. Neuropathy assessments were completed without difficulty with Yolande Jolly, RN assistance in the procedure room. The patient completed all of her study questionnaires without difficulty.  The timeline for the protocol was reviewed and the next assessment for her will be at her 24 week visit.    ABBIEGALE ALARID [id number] 06/14/2019  Adverse Event Log  Study/Protocol: SWOG [id number] Cycle: 4 , Week 12  Event Grade Onset Date Resolved Date Drug Name Attribution Treatment Comments  Sensory Peripheral Neuropathy 1 05/03/2019  Taxol Definitely    Constipation 0 05/03/2019 06/14/2019 Taxol Possible    Pain- abdominal 0 05/03/2019 06/14/2019  Possible    Alopecia 2 06/14/2019  Taxol Definitely    Skin Infection 2 06/14/2019  Taxol Possible               Jeral Fruit, RN, BSN, OCN 06/14/2019 0945 am
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
## 2019-06-14 NOTE — Assessment & Plan Note (Addendum)
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  Currently on neoadjuvant chemotherapy [TCH+P]; s/p cycle #3' US/Mammo-PR.  Stable.   # s/p  cycle # 5 today- [decrease Tax to 38m/m2]; Labs today reviewed.  Hold chemotherapy-see below.  We will plan to get reimaging of the breast after finishing #6 of chemotherapy.  Will order at next visit.  #Labial infected cyst-spontaneous drainage; continue Keflex.  Hold off any I&D.  # cognitive deficits- ? Chemo brain [got scammed/lost money]-monitor for now.  # Fatigue grade 1-from chemotherapy- stable.   # PN-[clinical trial] no symtoms today;   # DISPOSITION:   # HOLD chemo today; de-accessed # Followup in 1 week- MD-labs- cbc/cmp; TCH+P; onpro- Dr.B

## 2019-06-18 ENCOUNTER — Telehealth: Payer: Self-pay | Admitting: Nurse Practitioner

## 2019-06-18 ENCOUNTER — Other Ambulatory Visit: Payer: Self-pay

## 2019-06-18 NOTE — Telephone Encounter (Signed)
Spoke to pt to f/u re: labial cyst. Cyst improved. No pain or drainage. Finished antibiotics. Encouraged bath/shower daily with antibacterial soap to external tissue to reduce risk of re-infection. No shaving or waxing to reduce risk of trauma. If recurs, I'd be happy to see her back for re-evaluation.

## 2019-06-21 ENCOUNTER — Inpatient Hospital Stay (HOSPITAL_BASED_OUTPATIENT_CLINIC_OR_DEPARTMENT_OTHER): Payer: 59 | Admitting: Internal Medicine

## 2019-06-21 ENCOUNTER — Inpatient Hospital Stay: Payer: 59 | Admitting: *Deleted

## 2019-06-21 ENCOUNTER — Other Ambulatory Visit: Payer: Self-pay

## 2019-06-21 ENCOUNTER — Inpatient Hospital Stay: Payer: 59

## 2019-06-21 ENCOUNTER — Encounter: Payer: Self-pay | Admitting: Internal Medicine

## 2019-06-21 DIAGNOSIS — C50812 Malignant neoplasm of overlapping sites of left female breast: Secondary | ICD-10-CM

## 2019-06-21 DIAGNOSIS — Z171 Estrogen receptor negative status [ER-]: Secondary | ICD-10-CM | POA: Diagnosis not present

## 2019-06-21 DIAGNOSIS — Z95828 Presence of other vascular implants and grafts: Secondary | ICD-10-CM

## 2019-06-21 LAB — COMPREHENSIVE METABOLIC PANEL
ALT: 9 U/L (ref 0–44)
AST: 17 U/L (ref 15–41)
Albumin: 3.4 g/dL — ABNORMAL LOW (ref 3.5–5.0)
Alkaline Phosphatase: 48 U/L (ref 38–126)
Anion gap: 8 (ref 5–15)
BUN: 11 mg/dL (ref 6–20)
CO2: 25 mmol/L (ref 22–32)
Calcium: 8.8 mg/dL — ABNORMAL LOW (ref 8.9–10.3)
Chloride: 105 mmol/L (ref 98–111)
Creatinine, Ser: 0.51 mg/dL (ref 0.44–1.00)
GFR calc Af Amer: >60 mL/min (ref >60)
GFR calc non Af Amer: >60 mL/min (ref >60)
Glucose, Bld: 97 mg/dL (ref 70–99)
Potassium: 3.9 mmol/L (ref 3.5–5.1)
Sodium: 138 mmol/L (ref 135–145)
Total Bilirubin: 0.3 mg/dL (ref 0.3–1.2)
Total Protein: 6.2 g/dL — ABNORMAL LOW (ref 6.5–8.1)

## 2019-06-21 LAB — CBC WITH DIFFERENTIAL/PLATELET
Abs Immature Granulocytes: 0.02 10*3/uL (ref 0.00–0.07)
Basophils Absolute: 0 10*3/uL (ref 0.0–0.1)
Basophils Relative: 0 %
Eosinophils Absolute: 0.1 10*3/uL (ref 0.0–0.5)
Eosinophils Relative: 1 %
HCT: 33.7 % — ABNORMAL LOW (ref 36.0–46.0)
Hemoglobin: 11 g/dL — ABNORMAL LOW (ref 12.0–15.0)
Immature Granulocytes: 0 %
Lymphocytes Relative: 37 %
Lymphs Abs: 2.2 10*3/uL (ref 0.7–4.0)
MCH: 35.9 pg — ABNORMAL HIGH (ref 26.0–34.0)
MCHC: 32.6 g/dL (ref 30.0–36.0)
MCV: 110.1 fL — ABNORMAL HIGH (ref 80.0–100.0)
Monocytes Absolute: 0.5 10*3/uL (ref 0.1–1.0)
Monocytes Relative: 8 %
Neutro Abs: 3.1 10*3/uL (ref 1.7–7.7)
Neutrophils Relative %: 54 %
Platelets: 301 10*3/uL (ref 150–400)
RBC: 3.06 MIL/uL — ABNORMAL LOW (ref 3.87–5.11)
RDW: 15.8 % — ABNORMAL HIGH (ref 11.5–15.5)
WBC: 5.9 10*3/uL (ref 4.0–10.5)
nRBC: 0 % (ref 0.0–0.2)

## 2019-06-21 MED ORDER — LIDOCAINE-PRILOCAINE 2.5-2.5 % EX CREA: 1.0000 "application " | TOPICAL_CREAM | CUTANEOUS | 3 refills | Status: DC | PRN

## 2019-06-21 MED ORDER — SODIUM CHLORIDE 0.9 % IV SOLN
Freq: Once | INTRAVENOUS | Status: AC
  Filled 2019-06-21: qty 5

## 2019-06-21 MED ORDER — DIPHENHYDRAMINE HCL 25 MG PO CAPS
50.0000 mg | ORAL_CAPSULE | Freq: Once | ORAL | Status: AC
  Administered 2019-06-21: 50 mg via ORAL
  Filled 2019-06-21: qty 2

## 2019-06-21 MED ORDER — ACETAMINOPHEN 325 MG PO TABS
650.0000 mg | ORAL_TABLET | Freq: Once | ORAL | Status: AC
  Administered 2019-06-21: 10:00:00 650 mg via ORAL
  Filled 2019-06-21: qty 2

## 2019-06-21 MED ORDER — HEPARIN SOD (PORK) LOCK FLUSH 100 UNIT/ML IV SOLN
500.0000 [IU] | Freq: Once | INTRAVENOUS | Status: AC | PRN
  Administered 2019-06-21: 500 [IU]
  Filled 2019-06-21: qty 5

## 2019-06-21 MED ORDER — HEPARIN SOD (PORK) LOCK FLUSH 100 UNIT/ML IV SOLN
INTRAVENOUS | Status: AC
  Filled 2019-06-21: qty 5

## 2019-06-21 MED ORDER — SODIUM CHLORIDE 0.9 % IV SOLN
Freq: Once | INTRAVENOUS | Status: AC
  Filled 2019-06-21: qty 250

## 2019-06-21 MED ORDER — PALONOSETRON HCL INJECTION 0.25 MG/5ML
0.2500 mg | Freq: Once | INTRAVENOUS | Status: AC
  Administered 2019-06-21: 0.25 mg via INTRAVENOUS
  Filled 2019-06-21: qty 5

## 2019-06-21 MED ORDER — SODIUM CHLORIDE 0.9% FLUSH
10.0000 mL | Freq: Once | INTRAVENOUS | Status: AC
  Administered 2019-06-21: 10 mL via INTRAVENOUS
  Filled 2019-06-21: qty 10

## 2019-06-21 MED ORDER — SODIUM CHLORIDE 0.9 % IV SOLN
513.5000 mg | Freq: Once | INTRAVENOUS | Status: AC
  Administered 2019-06-21: 510 mg via INTRAVENOUS
  Filled 2019-06-21: qty 51

## 2019-06-21 MED ORDER — TRASTUZUMAB-ANNS CHEMO 150 MG IV SOLR
340.0000 mg | Freq: Once | INTRAVENOUS | Status: AC
  Administered 2019-06-21: 340 mg via INTRAVENOUS
  Filled 2019-06-21: qty 16.19

## 2019-06-21 MED ORDER — SODIUM CHLORIDE 0.9 % IV SOLN
420.0000 mg | Freq: Once | INTRAVENOUS | Status: AC
  Administered 2019-06-21: 420 mg via INTRAVENOUS
  Filled 2019-06-21: qty 14

## 2019-06-21 MED ORDER — PEGFILGRASTIM 6 MG/0.6ML ~~LOC~~ PSKT
6.0000 mg | PREFILLED_SYRINGE | Freq: Once | SUBCUTANEOUS | Status: AC
  Administered 2019-06-21: 6 mg via SUBCUTANEOUS
  Filled 2019-06-21: qty 0.6

## 2019-06-21 MED ORDER — SODIUM CHLORIDE 0.9 % IV SOLN
60.0000 mg/m2 | Freq: Once | INTRAVENOUS | Status: AC
  Administered 2019-06-21: 100 mg via INTRAVENOUS
  Filled 2019-06-21: qty 10

## 2019-06-21 NOTE — Assessment & Plan Note (Addendum)
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  Currently on neoadjuvant chemotherapy [TCH+P]; s/p cycle #3' US/Mammo-PR.  STABLE. s/p  cycle # 5 today- [decrease Tax to '60mg'$ /m2];   # Proceed with chemo today; TCH+P today. Labs today reviewed;  acceptable for treatment today. We will plan to get reimaging of the breast after finishing #6 of chemotherapy.  Will order today.  Again reviewed the timeline for possible surgery; will discuss with Dr. Lysle Pearl.  # Labial infected cyst-spontaneous drainage; s/p Keflex.improved.   # Fatigue grade 1-from chemotherapy- stable.   # PN-[clinical trial]-grade 1.  Stable  # DISPOSITION:   # chemo today; # Followup in 3 weeks;MD; labs- cbc/cmp; NO chemo; left breast diagnistic  Mammo/US prior- Dr.B
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
## 2019-06-28 ENCOUNTER — Telehealth: Payer: Self-pay | Admitting: *Deleted

## 2019-06-28 ENCOUNTER — Other Ambulatory Visit: Payer: Self-pay

## 2019-06-28 ENCOUNTER — Inpatient Hospital Stay: Payer: 59 | Attending: Nurse Practitioner | Admitting: Nurse Practitioner

## 2019-06-28 ENCOUNTER — Encounter: Payer: Self-pay | Admitting: Nurse Practitioner

## 2019-06-28 VITALS — BP 105/73 | HR 91 | Temp 97.4°F | Resp 16 | Wt 118.4 lb

## 2019-06-28 DIAGNOSIS — R239 Unspecified skin changes: Secondary | ICD-10-CM | POA: Diagnosis not present

## 2019-06-28 DIAGNOSIS — T451X5A Adverse effect of antineoplastic and immunosuppressive drugs, initial encounter: Secondary | ICD-10-CM | POA: Diagnosis not present

## 2019-06-28 DIAGNOSIS — Z79899 Other long term (current) drug therapy: Secondary | ICD-10-CM | POA: Diagnosis not present

## 2019-06-28 DIAGNOSIS — G620 Drug-induced polyneuropathy: Secondary | ICD-10-CM | POA: Diagnosis not present

## 2019-06-28 DIAGNOSIS — C50812 Malignant neoplasm of overlapping sites of left female breast: Secondary | ICD-10-CM | POA: Insufficient documentation

## 2019-06-28 DIAGNOSIS — Z171 Estrogen receptor negative status [ER-]: Secondary | ICD-10-CM | POA: Insufficient documentation

## 2019-06-28 MED ORDER — TRIAMCINOLONE ACETONIDE 0.1 % EX CREA: 1.0000 "application " | TOPICAL_CREAM | Freq: Two times a day (BID) | CUTANEOUS | 0 refills | Status: DC
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
## 2019-06-28 NOTE — Telephone Encounter (Signed)
930 am - Patient came to front desk to pick up gas voucher. She spoke with front receptionist. C/o of splitting cracking hands and numbness and tingling in hands. She requested a call back from nurse. Pt left clinic to go get gas before msg was received by RN.  I spoke with Dr. Rogue Bussing. He requested pt to be evaluated in the Symptom mgmt clinic today. I attempted to reach patient back. She stated that it would take about 40-45 mins to come back to the clinic. Apt made with Ander Purpura, NP at 1030:

## 2019-07-01 ENCOUNTER — Ambulatory Visit
Admission: RE | Admit: 2019-07-01 | Discharge: 2019-07-01 | Disposition: A | Discharge status: Home / Self Care | Payer: 59 | Source: Ambulatory Visit | Attending: Internal Medicine | Admitting: Internal Medicine

## 2019-07-01 DIAGNOSIS — Z171 Estrogen receptor negative status [ER-]: Secondary | ICD-10-CM

## 2019-07-01 DIAGNOSIS — C50812 Malignant neoplasm of overlapping sites of left female breast: Secondary | ICD-10-CM | POA: Insufficient documentation

## 2019-07-12 ENCOUNTER — Inpatient Hospital Stay: Payer: 59

## 2019-07-12 ENCOUNTER — Encounter: Payer: Self-pay | Admitting: Internal Medicine

## 2019-07-12 ENCOUNTER — Inpatient Hospital Stay (HOSPITAL_BASED_OUTPATIENT_CLINIC_OR_DEPARTMENT_OTHER): Payer: 59 | Admitting: Internal Medicine

## 2019-07-12 DIAGNOSIS — C50812 Malignant neoplasm of overlapping sites of left female breast: Secondary | ICD-10-CM | POA: Diagnosis not present

## 2019-07-12 DIAGNOSIS — Z171 Estrogen receptor negative status [ER-]: Secondary | ICD-10-CM

## 2019-07-12 LAB — CBC WITH DIFFERENTIAL/PLATELET
Abs Immature Granulocytes: 0.02 10*3/uL (ref 0.00–0.07)
Basophils Absolute: 0 10*3/uL (ref 0.0–0.1)
Basophils Relative: 0 %
Eosinophils Absolute: 0 10*3/uL (ref 0.0–0.5)
Eosinophils Relative: 0 %
HCT: 34.3 % — ABNORMAL LOW (ref 36.0–46.0)
Hemoglobin: 11.6 g/dL — ABNORMAL LOW (ref 12.0–15.0)
Immature Granulocytes: 0 %
Lymphocytes Relative: 38 %
Lymphs Abs: 2.8 10*3/uL (ref 0.7–4.0)
MCH: 36.5 pg — ABNORMAL HIGH (ref 26.0–34.0)
MCHC: 33.8 g/dL (ref 30.0–36.0)
MCV: 107.9 fL — ABNORMAL HIGH (ref 80.0–100.0)
Monocytes Absolute: 0.9 10*3/uL (ref 0.1–1.0)
Monocytes Relative: 12 %
Neutro Abs: 3.6 10*3/uL (ref 1.7–7.7)
Neutrophils Relative %: 50 %
Platelets: 270 10*3/uL (ref 150–400)
RBC: 3.18 MIL/uL — ABNORMAL LOW (ref 3.87–5.11)
RDW: 14.9 % (ref 11.5–15.5)
WBC: 7.3 10*3/uL (ref 4.0–10.5)
nRBC: 0 % (ref 0.0–0.2)

## 2019-07-12 LAB — COMPREHENSIVE METABOLIC PANEL
ALT: 12 U/L (ref 0–44)
AST: 17 U/L (ref 15–41)
Albumin: 3.6 g/dL (ref 3.5–5.0)
Alkaline Phosphatase: 63 U/L (ref 38–126)
Anion gap: 6 (ref 5–15)
BUN: 9 mg/dL (ref 6–20)
CO2: 24 mmol/L (ref 22–32)
Calcium: 8.8 mg/dL — ABNORMAL LOW (ref 8.9–10.3)
Chloride: 105 mmol/L (ref 98–111)
Creatinine, Ser: 0.62 mg/dL (ref 0.44–1.00)
GFR calc Af Amer: >60 mL/min (ref >60)
GFR calc non Af Amer: >60 mL/min (ref >60)
Glucose, Bld: 107 mg/dL — ABNORMAL HIGH (ref 70–99)
Potassium: 4.6 mmol/L (ref 3.5–5.1)
Sodium: 135 mmol/L (ref 135–145)
Total Bilirubin: 0.4 mg/dL (ref 0.3–1.2)
Total Protein: 6.7 g/dL (ref 6.5–8.1)
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
## 2019-07-12 NOTE — Assessment & Plan Note (Addendum)
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  Currently on neoadjuvant chemotherapy [TCH+P]; s/p cycle #6- US/Mammo-continued response to therapy; partial response.   #Recommend proceeding with surgery; understands that patient will need adjuvant therapy-Herceptin Perjeta versus TDM 1 based upon pathology results.  #Genetic susceptibility-patient/mother concerned about genetics predisposition [no first-degree relative with ovarian/breast cancer; however family history is limited]-recommend genetics evaluation ASAP.   # Fatigue grade 1-from chemotherapy-stable  # PN-[clinical trial]-grade 1.  Stable  # I discussed regarding Covid-19 precautions.  I reviewed the vaccine effectiveness and potential side effects in detail.  Also discussed long-term effectiveness and safety profile are unclear at this time.  I discussed December, 2020 [facility] position statement-that all patients are recommended COVID-19 vaccinations [when available]-as long as they do not have allergy to components of the vaccine.  However, I think the benefits of the vaccination outweigh the potential risks. Re: [id number]-62 vaccination.    # DISPOSITION:  Please give pt Dr.Sakai's office number # genetic Counsellor- referral breast cancer- ASAP # follow up 2 weeks after breast surgery- pt will call to set up appt.-dr.B.

## 2019-07-12 NOTE — Patient Instructions (Addendum)
#   call us to make appt with [place] when you have your surgery date available.   # For more information/scheduling recommend call [place] [phone], 8:30am-4:30pm.

## 2019-07-13 ENCOUNTER — Inpatient Hospital Stay: Payer: 59

## 2019-07-13 DIAGNOSIS — Z171 Estrogen receptor negative status [ER-]: Secondary | ICD-10-CM

## 2019-07-13 DIAGNOSIS — C50812 Malignant neoplasm of overlapping sites of left female breast: Secondary | ICD-10-CM

## 2019-07-15 ENCOUNTER — Inpatient Hospital Stay: Payer: 59 | Attending: Genetic Counselor | Admitting: Licensed Clinical Social Worker

## 2019-07-15 DIAGNOSIS — Z803 Family history of malignant neoplasm of breast: Secondary | ICD-10-CM | POA: Diagnosis not present

## 2019-07-15 DIAGNOSIS — Z806 Family history of leukemia: Secondary | ICD-10-CM | POA: Insufficient documentation

## 2019-07-15 DIAGNOSIS — C50812 Malignant neoplasm of overlapping sites of left female breast: Secondary | ICD-10-CM

## 2019-07-15 DIAGNOSIS — Z171 Estrogen receptor negative status [ER-]: Secondary | ICD-10-CM | POA: Diagnosis not present
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
## 2019-07-16 ENCOUNTER — Telehealth: Payer: Self-pay | Admitting: *Deleted

## 2019-07-16 NOTE — Telephone Encounter (Signed)
Patient called stating she forgot to ask a couple of questions, first, she broke her partial and needs to have it repaired and wants to know when she could arrange this with dentist. Second when can she eat shellfish again. Please return her call [phone]

## 2019-07-19 ENCOUNTER — Ambulatory Visit: Payer: 59 | Attending: Internal Medicine

## 2019-07-19 DIAGNOSIS — Z23 Encounter for immunization: Secondary | ICD-10-CM

## 2019-07-19 NOTE — Progress Notes (Signed)
   Covid-19 Vaccination Clinic  Name:  [name]    MRN: [id number] DOB: 1971/02/21  07/19/2019  [name] was observed post Covid-19 immunization for 15 minutes without incident. She was provided with Vaccine Information Sheet and instruction to access the V-Safe system.   [name] was instructed to call 911 with any severe reactions post vaccine: Marland Kitchen Difficulty breathing  . Swelling of face and throat  . A fast heartbeat  . A bad rash all over body  . Dizziness and weakness   Immunizations Administered    Name Date Dose VIS Date Route   Pfizer COVID-19 Vaccine 07/19/2019 11:25 AM 0.3 mL 04/09/2019 Intramuscular   Manufacturer: [place]   Lot: [id number]   [place]: [id number]

## 2019-07-19 NOTE — Telephone Encounter (Signed)
Call returned to patient and advised that she may eat shellfish if she desires and that she can see dentist , no oncology clearance needed

## 2019-07-19 NOTE — Telephone Encounter (Signed)
Please advise 

## 2019-07-19 NOTE — Telephone Encounter (Signed)
Patient may resuming eat shellfish if she desires. She may proceed with dental apt. No oncology clearance needed per Dr. Jacinto Reap

## 2019-07-21 ENCOUNTER — Encounter: Payer: Self-pay | Admitting: Licensed Clinical Social Worker

## 2019-07-21 ENCOUNTER — Telehealth: Payer: Self-pay | Admitting: Licensed Clinical Social Worker

## 2019-07-21 ENCOUNTER — Ambulatory Visit: Payer: Self-pay | Admitting: Licensed Clinical Social Worker

## 2019-07-21 DIAGNOSIS — C50812 Malignant neoplasm of overlapping sites of left female breast: Secondary | ICD-10-CM

## 2019-07-21 DIAGNOSIS — Z803 Family history of malignant neoplasm of breast: Secondary | ICD-10-CM

## 2019-07-21 DIAGNOSIS — Z806 Family history of leukemia: Secondary | ICD-10-CM

## 2019-07-21 DIAGNOSIS — Z1379 Encounter for other screening for genetic and chromosomal anomalies: Secondary | ICD-10-CM | POA: Insufficient documentation

## 2019-07-21 NOTE — Progress Notes (Signed)
HPI:  [name] was previously seen in the [place] clinic due to a personal and family history of cancer and concerns regarding a hereditary predisposition to cancer. Please refer to our prior cancer genetics clinic note for more information regarding our discussion, assessment and recommendations, at the time. [name] recent genetic test results were disclosed to her, as were recommendations warranted by these results. These results and recommendations are discussed in more detail below.  CANCER HISTORY:  Oncology History Overview Note  # OCT 2020- Left breast-invasive mammary carcinoma; [Dr.Sakai]  # A] Left breast upper outer quadrant stereotactic biopsy- 72m- IMC; morphologically similar to B  # B ] Ultrasound-guided left breast biopsy at 2 o'clock position- 490m G-3; NO LVI; ER/PR-NEG; HER 2 Neu-POSITIVE  # C] LEFT BREAST UPPER INNER QUAD- 11'O CLOCK-BIOPSED- 0.8x0.6x.09 cm- negative for malignancy  # OCT 30th- 2020-NEO-ADJUVANT TCH+P; Nov 23rd cycle #2- Taxotere [dose decreased to 6043m2-sec to diarrhea]  DIAGNOSIS: Left breast cancer  STAGE:   Stage 1    ;  GOALS: Cure  CURRENT/MOST RECENT THERAPY : TCH+P [C]    Carcinoma of overlapping sites of left breast in female, estrogen receptor negative ([facility]10/15/2020 Initial Diagnosis   Carcinoma of overlapping sites of left breast in female, estrogen receptor negative ([facility] 02/16/2019 Cancer Staging   Staging form: Breast, AJCC 8th Edition - Clinical stage from 02/16/2019: Stage IA (cT1, cN0, cM0, G3, ER-, PR-, HER2+) - Signed by RaoSindy GuadeloupeD on 02/17/2019   03/01/2019 -  Chemotherapy   The patient had palonosetron (ALOXI) injection 0.25 mg, 0.25 mg, Intravenous,  Once, 6 of 6 cycles Administration: 0.25 mg (03/01/2019), 0.25 mg (03/22/2019), 0.25 mg (05/24/2019), 0.25 mg (06/21/2019), 0.25 mg (04/12/2019), 0.25 mg (05/03/2019) pegfilgrastim (NEULASTA ONPRO KIT) injection 6 mg, 6 mg, Subcutaneous,  Once, 6 of 6 cycles Administration: 6 mg (03/01/2019), 6 mg (03/22/2019), 6 mg (05/24/2019), 6 mg (06/21/2019), 6 mg (04/12/2019), 6 mg (05/03/2019) CARBOplatin (PARAPLATIN) 510 mg in sodium chloride 0.9 % 250 mL chemo infusion, 510 mg (83.3 % of original dose 616.2 mg), Intravenous,  Once, 6 of 6 cycles Dose modification:   (original dose 616.2 mg, Cycle 1) Administration: 510 mg (03/01/2019), 510 mg (03/22/2019), 510 mg (05/24/2019), 510 mg (06/21/2019), 510 mg (04/12/2019), 510 mg (05/03/2019) DOCEtaxel (TAXOTERE) 120 mg in sodium chloride 0.9 % 250 mL chemo infusion, 75 mg/m2 = 120 mg, Intravenous,  Once, 6 of 6 cycles Dose modification: 60 mg/m2 (original dose 75 mg/m2, Cycle 2, Reason: Provider Judgment) Administration: 120 mg (03/01/2019), 100 mg (03/22/2019), 100 mg (05/24/2019), 100 mg (06/21/2019), 100 mg (04/12/2019), 100 mg (05/03/2019) pertuzumab (PERJETA) 840 mg in sodium chloride 0.9 % 250 mL chemo infusion, 840 mg, Intravenous, Once, 6 of 6 cycles Administration: 840 mg (03/01/2019), 420 mg (03/22/2019), 420 mg (05/24/2019), 420 mg (06/21/2019), 420 mg (04/12/2019), 420 mg (05/03/2019) fosaprepitant (EMEND) 150 mg, dexamethasone (DECADRON) 12 mg in sodium chloride 0.9 % 145 mL IVPB, , Intravenous,  Once, 6 of 6 cycles Administration:  (03/01/2019),  (03/22/2019),  (05/24/2019),  (06/21/2019),  (04/12/2019),  (05/03/2019) trastuzumab-anns (KANJINTI) 450 mg in sodium chloride 0.9 % 250 mL chemo infusion, 462 mg, Intravenous,  Once, 6 of 6 cycles Dose modification: 6 mg/kg (original dose 6 mg/kg, Cycle 2, Reason: Other (see comments), Comment: insurance) Administration: 450 mg (03/01/2019), 300 mg (03/22/2019), 300 mg (04/12/2019), 340 mg (05/24/2019), 340 mg (06/21/2019), 340 mg (05/03/2019)  for chemotherapy treatment.    07/20/2019 Genetic Testing  Negative genetic testing. No pathogenic variants identified on the [facility] Breast Cancer STAT Panel + Common Hereditary Cancers Panel. The report date is  07/20/2019.  The STAT Breast cancer panel offered by [facility] includes sequencing and rearrangement analysis for the following 9 genes:  ATM, BRCA1, BRCA2, CDH1, CHEK2, PALB2, PTEN, STK11 and TP53.    The Common Hereditary Cancers Panel offered by [facility] includes sequencing and/or deletion duplication testing of the following 48 genes: APC, ATM, AXIN2, BARD1, BMPR1A, BRCA1, BRCA2, BRIP1, CDH1, CDKN2A (p14ARF), CDKN2A (p16INK4a), CKD4, CHEK2, CTNNA1, DICER1, EPCAM (Deletion/duplication testing only), GREM1 (promoter region deletion/duplication testing only), KIT, MEN1, MLH1, MSH2, MSH3, MSH6, MUTYH, NBN, NF1, NHTL1, PALB2, PDGFRA, PMS2, POLD1, POLE, PTEN, RAD50, RAD51C, RAD51D, RNF43, SDHB, SDHC, SDHD, SMAD4, SMARCA4. STK11, TP53, TSC1, TSC2, and VHL.  The following genes were evaluated for sequence changes only: SDHA and HOXB13 c.251G>A variant only.     FAMILY HISTORY:  We obtained a detailed, 4-generation family history.  Significant diagnoses are listed below: Family History  Problem Relation Age of Onset  . Hyperlipidemia Mother   . Depression Mother   . Fibromyalgia Mother   . Leukemia Maternal Grandmother   . Cancer Other        breast  . Cancer Cousin        possibly ovarian    [name] has 1 son, Valarie Merino, who is living at 54. She has 1 maternal half brother and 1 maternal half sister, no cancers.  [name] mother is living at 83. She has limited information about her maternal side of the family. She has 2 maternal aunts, both living and 1 maternal uncle, deceased. Her uncle has a daughter who possibly had ovarian cancer and is deceased. Maternal grandmother had leukemia and died in her 24s. Maternal grandmother's sister had breast cancer. Maternal grandfather died in his 61s.  [name] father is living at 31, no cancers. Patient has 1 paternal aunt. Limited information on this side of the family as well. Patient does not have paternal first cousins. Paternal  grandmother is living, no cancer. Her father had cancer, unk type. Paternal grandfather is deceased.  [name] is unaware of previous family history of genetic testing for hereditary cancer risks. Patient's maternal ancestors are of [place]/unknown descent, and paternal ancestors are of English descent. There is no reported Ashkenazi Jewish ancestry. There is no known consanguinity.  GENETIC TEST RESULTS: Genetic testing reported out on 07/20/2019 through the [facility] Breast Cancer STAT Panel + Common Hereditary cancer panel found no pathogenic mutations.  The STAT Breast cancer panel offered by [facility] includes sequencing and rearrangement analysis for the following 9 genes:  ATM, BRCA1, BRCA2, CDH1, CHEK2, PALB2, PTEN, STK11 and TP53.    The Common Hereditary Cancers Panel offered by [facility] includes sequencing and/or deletion duplication testing of the following 48 genes: APC, ATM, AXIN2, BARD1, BMPR1A, BRCA1, BRCA2, BRIP1, CDH1, CDKN2A (p14ARF), CDKN2A (p16INK4a), CKD4, CHEK2, CTNNA1, DICER1, EPCAM (Deletion/duplication testing only), GREM1 (promoter region deletion/duplication testing only), KIT, MEN1, MLH1, MSH2, MSH3, MSH6, MUTYH, NBN, NF1, NHTL1, PALB2, PDGFRA, PMS2, POLD1, POLE, PTEN, RAD50, RAD51C, RAD51D, RNF43, SDHB, SDHC, SDHD, SMAD4, SMARCA4. STK11, TP53, TSC1, TSC2, and VHL.  The following genes were evaluated for sequence changes only: SDHA and HOXB13 c.251G>A variant only.  The test report has been scanned into EPIC and is located under the Molecular Pathology section of the Results Review tab.  A portion of the result report is included below for reference.     We  discussed with Ms. Looper that because current genetic testing is not perfect, it is possible there may be a gene mutation in one of these genes that current testing cannot detect, but that chance is small.  We also discussed, that there could be another gene that has not yet been discovered, or that we have not  yet tested, that is responsible for the cancer diagnoses in the family. It is also possible there is a hereditary cause for the cancer in the family that Ms. Warwick did not inherit and therefore was not identified in her testing.  Therefore, it is important to remain in touch with cancer genetics in the future so that we can continue to offer Ms. Choung the most up to date genetic testing.   ADDITIONAL GENETIC TESTING: We discussed with Ms. Stambaugh that her genetic testing was fairly extensive.  If there are genes identified to increase cancer risk that can be analyzed in the future we would be happy to discuss and coordinate this testing at that time.    CANCER SCREENING RECOMMENDATIONS: Ms. Nola test result is considered negative (normal). This means that we have not identified a hereditary cause for her  personal and family history of cancer at this time. Most cancers happen by chance and this negative test suggests that her cancer may fall into this category.    While reassuring, this does not definitively rule out a hereditary predisposition to cancer. It is still possible that there could be genetic mutations that are undetectable by current technology. There could be genetic mutations in genes that have not been tested or identified to increase cancer risk.  Therefore, it is recommended she continue to follow the cancer management and screening guidelines provided by her oncology and primary healthcare provider.   An individual's cancer risk and medical management are not determined by genetic test results alone. Overall cancer risk assessment incorporates additional factors, including personal medical history, family history, and any available genetic information that may result in a personalized plan for cancer prevention and surveillance.  RECOMMENDATIONS FOR FAMILY MEMBERS:  Relatives in this family might be at some increased risk of developing cancer, over the general population risk,  simply due to the family history of cancer.  We recommended female relatives in this family have a yearly mammogram beginning at age 4, or 80 years younger than the earliest onset of cancer, an annual clinical breast exam, and perform monthly breast self-exams. Female relatives in this family should also have a gynecological exam as recommended by their primary provider. All family members should have a colonoscopy by age 3, or as directed by their physicians.   It is also possible there is a hereditary cause for the cancer in Ms. Hanaway's family that she did not inherit and therefore was not identified in her.  Based on Ms. Gatley's family history, we recommended those related to her cousin who had ovarian cancer have genetic counseling and testing. Ms. Ohlson will let us know if we can be of any assistance in coordinating genetic counseling and/or testing for these family members.  FOLLOW-UP: Lastly, we discussed with Ms. Narciso that cancer genetics is a rapidly advancing field and it is possible that new genetic tests will be appropriate for her and/or her family members in the future. We encouraged her to remain in contact with cancer genetics on an annual basis so we can update her personal and family histories and let her know of advances in cancer genetics that may benefit  this family.   Our contact number was provided. Ms. Hebdon questions were answered to her satisfaction, and she knows she is welcome to call us at anytime with additional questions or concerns.   Faith Rogue, MS, [facility] Genetic Counselor [email or web address] Phone: [phone]

## 2019-07-21 NOTE — Telephone Encounter (Signed)
Revealed negative genetic testing.   We discussed that we do not know why she has breast cancer or why there is cancer in the family. It could be due to a different gene that we are not testing, or something our current technology cannot pick up.  It will be important for her to keep in contact with genetics to learn if additional testing may be needed in the future.  

## 2019-07-27 ENCOUNTER — Telehealth: Payer: Self-pay | Admitting: Internal Medicine

## 2019-07-27 ENCOUNTER — Other Ambulatory Visit: Payer: Self-pay | Admitting: Surgery

## 2019-07-27 DIAGNOSIS — C50912 Malignant neoplasm of unspecified site of left female breast: Secondary | ICD-10-CM

## 2019-07-27 NOTE — Telephone Encounter (Signed)
On 3/29-I had a long discussion with patient that in terms of survival outcomes-mastectomy versus lumpectomy with radiation are similar.  Discussed the pros and cons of each option.  Discussed that my preference would be to go back lumpectomy with radiation [if that is feasible as per surgery]-especially as patient is BRCA negative.   Patient feels comfortable to make decision with regards to type of surgery-after my discussion.  She states that she will discuss further with Dr. Lysle Pearl.  I left a message with Dr. Lysle Pearl to discuss the same.

## 2019-07-28 ENCOUNTER — Ambulatory Visit: Payer: Self-pay | Admitting: Surgery

## 2019-07-28 NOTE — H&P (Signed)
  Subjective:   CC: Malignant neoplasm of upper-outer quadrant of left female breast, unspecified estrogen receptor status (CMS-HCC) [C50.412] f/u  HPI: [name] is a 49 y.o. female who is here for followup from above. Finished chemo, ready to discuss surgical options   Current Medications: has a current medication list which includes the following prescription(s): acetaminophen, cholecalciferol, cyanocobalamin, evolocumab, ibuprofen, lidocaine-prilocaine, melatonin, multivitamin, ondansetron, pramoxine-hydrocortisone, prochlorperazine, sertraline, and sennosides-docusate.  Allergies:  Allergies  Allergen Reactions  . Atorvastatin Muscle Pain  . Other Swelling  Nail polish causes eyelids to swell  . Rosuvastatin Other (See Comments) and Muscle Pain   ROS: General: Denies weight loss, weight gain, fatigue, fevers, chills, and night sweats. Heart: Denies chest pain, palpitations, racing heart, irregular heartbeat, leg pain or swelling, and decreased activity tolerance. Respiratory: Denies breathing difficulty, shortness of breath, wheezing, cough, and sputum. GI: Denies change in appetite, heartburn, nausea, vomiting, constipation, diarrhea, and blood in stool. GU: Denies difficulty urinating, pain with urinating, urgency, frequency, blood in urine   Objective:    BP 117/76  Pulse 73  Ht 165.1 cm (5\' 5" )  Wt 55.3 kg (122 lb)  BMI 20.30 kg/m   Constitutional : alert, appears stated age, cooperative and no distress  Musculoskeletal: Steady gait and movement  Skin: Cool and moist, incision clean, dry, intact. Increased color over the port site, but not necessarily erythema, non-blanching  Psychiatric: Normal affect, non-agitated, not confused  Breast: Chaperone present for exam. No significant change since previous exam, with barely palpable mass. No axillary lymphadenopathy   LABS:  N/A   RADS: N/A  Assessment:    Malignant neoplasm of upper-outer quadrant of left  female breast, unspecified estrogen receptor status (CMS-HCC) [C50.412]   Plan:   Pt requesting proceeding with lumpectomy, SLNB, and subsequent radiation therapy.   Discussed the risk of surgery including recurrence, chronic pain, post-op infxn, poor/delayed wound healing, poor cosmesis, seroma, hematoma formation, and possible re-operation to address said risks. The risks of general anesthetic, if used, includes MI, CVA, sudden death or even reaction to anesthetic medications also discussed.  Typical post-op recovery time and possbility of activity restrictions were also discussed. Alternatives include continued observation. Benefits include possible symptom relief, pathologic evaluation, and/or curative excision.   The patient verbalized understanding and all questions were answered to the patient's satisfaction.  Will schedule for hologic marker placement, SLNB.   Malignant neoplasm of upper-outer quadrant of left female breast, unspecified estrogen receptor status (CMS-HCC) [C50.412]   [place], [phone]

## 2019-07-28 NOTE — H&P (View-Only) (Signed)
  Subjective:   CC: Malignant neoplasm of upper-outer quadrant of left female breast, unspecified estrogen receptor status (CMS-HCC) [C50.412] f/u  HPI: [name] is a 49 y.o. female who is here for followup from above. Finished chemo, ready to discuss surgical options   Current Medications: has a current medication list which includes the following prescription(s): acetaminophen, cholecalciferol, cyanocobalamin, evolocumab, ibuprofen, lidocaine-prilocaine, melatonin, multivitamin, ondansetron, pramoxine-hydrocortisone, prochlorperazine, sertraline, and sennosides-docusate.  Allergies:  Allergies  Allergen Reactions  . Atorvastatin Muscle Pain  . Other Swelling  Nail polish causes eyelids to swell  . Rosuvastatin Other (See Comments) and Muscle Pain   ROS: General: Denies weight loss, weight gain, fatigue, fevers, chills, and night sweats. Heart: Denies chest pain, palpitations, racing heart, irregular heartbeat, leg pain or swelling, and decreased activity tolerance. Respiratory: Denies breathing difficulty, shortness of breath, wheezing, cough, and sputum. GI: Denies change in appetite, heartburn, nausea, vomiting, constipation, diarrhea, and blood in stool. GU: Denies difficulty urinating, pain with urinating, urgency, frequency, blood in urine   Objective:    BP 117/76  Pulse 73  Ht 165.1 cm (5\' 5" )  Wt 55.3 kg (122 lb)  BMI 20.30 kg/m   Constitutional : alert, appears stated age, cooperative and no distress  Musculoskeletal: Steady gait and movement  Skin: Cool and moist, incision clean, dry, intact. Increased color over the port site, but not necessarily erythema, non-blanching  Psychiatric: Normal affect, non-agitated, not confused  Breast: Chaperone present for exam. No significant change since previous exam, with barely palpable mass. No axillary lymphadenopathy   LABS:  N/A   RADS: N/A  Assessment:    Malignant neoplasm of upper-outer quadrant of left  female breast, unspecified estrogen receptor status (CMS-HCC) [C50.412]   Plan:   Pt requesting proceeding with lumpectomy, SLNB, and subsequent radiation therapy.   Discussed the risk of surgery including recurrence, chronic pain, post-op infxn, poor/delayed wound healing, poor cosmesis, seroma, hematoma formation, and possible re-operation to address said risks. The risks of general anesthetic, if used, includes MI, CVA, sudden death or even reaction to anesthetic medications also discussed.  Typical post-op recovery time and possbility of activity restrictions were also discussed. Alternatives include continued observation. Benefits include possible symptom relief, pathologic evaluation, and/or curative excision.   The patient verbalized understanding and all questions were answered to the patient's satisfaction.  Will schedule for hologic marker placement, SLNB.   Malignant neoplasm of upper-outer quadrant of left female breast, unspecified estrogen receptor status (CMS-HCC) [C50.412]   [place], [phone]

## 2019-07-30 ENCOUNTER — Telehealth: Payer: Self-pay | Admitting: Internal Medicine

## 2019-07-30 ENCOUNTER — Other Ambulatory Visit: Payer: Self-pay

## 2019-07-30 ENCOUNTER — Encounter
Admission: RE | Admit: 2019-07-30 | Discharge: 2019-07-30 | Disposition: A | Discharge status: Home / Self Care | Payer: 59 | Source: Ambulatory Visit | Attending: Surgery | Admitting: Surgery

## 2019-07-30 NOTE — Telephone Encounter (Signed)
Please have the patient follow-up with me approximately 2 weeks post surgery. Surgery is planned on 4/08.   MD- No labs.

## 2019-07-30 NOTE — Patient Instructions (Signed)
Your procedure is scheduled on: Thursday 08/05/19.  Report to  [facility] at 10:15 for Nuclear Medicine Sentinel Node Injection   Remember: Instructions that are not followed completely may result in serious medical risk, up to and including death, or upon the discretion of your surgeon and anesthesiologist your surgery may need to be rescheduled.      _X__ 1. Do not eat food after midnight the night before your procedure.                 No gum chewing or hard candies. You may drink clear liquids up to 2 hours                 before you are scheduled to arrive for your surgery- DO NOT drink clear                 liquids within 2 hours of the start of your surgery.                 Clear Liquids include:  water, apple juice without pulp, clear carbohydrate                 drink such as Clearfast or Gatorade, Black Coffee or Tea (Do not add                 anything to coffee or tea).   __X__2.  On the morning of surgery brush your teeth with toothpaste and water, you may rinse your mouth with mouthwash if you wish.  Do not swallow any toothpaste or mouthwash.      _X__ 3.  No Alcohol for 24 hours before or after surgery.     _X__ 4.  Do Not Smoke or use e-cigarettes For 24 Hours Prior to Your Surgery.                 Do not use any chewable tobacco products for at least 6 hours prior to                 surgery.    __X__5.  Notify your doctor if there is any change in your medical condition      (cold, fever, infections).      Do not wear jewelry, make-up, hairpins, clips or nail polish. Do not wear lotions, powders, or perfumes.  Do not shave 48 hours prior to surgery. Men may shave face and neck. Do not bring valuables to the hospital.     [facility] is not responsible for any belongings or valuables.  Contacts, dentures/partials or body piercings may not be worn into surgery. Bring a case for your contacts, glasses or hearing aids, a denture cup will be  supplied.    Patients discharged the day of surgery will not be allowed to drive home.     __X__ Take these medicines the morning of surgery with A SIP OF WATER:     1. NONE     __X__ Use CHG Soap as directed.    __X__ Stop Anti-inflammatories 7 days before surgery such as Advil, Ibuprofen, Motrin, BC or Goodies Powder, Naprosyn, Naproxen, Aleve, Aspirin, Meloxicam. May take Tylenol if needed for pain or discomfort.

## 2019-08-03 ENCOUNTER — Other Ambulatory Visit
Admission: RE | Admit: 2019-08-03 | Discharge: 2019-08-03 | Disposition: A | Discharge status: Home / Self Care | Payer: 59 | Source: Ambulatory Visit | Attending: Surgery | Admitting: Surgery

## 2019-08-03 ENCOUNTER — Other Ambulatory Visit: Payer: Self-pay

## 2019-08-03 DIAGNOSIS — Z01812 Encounter for preprocedural laboratory examination: Secondary | ICD-10-CM | POA: Insufficient documentation

## 2019-08-03 DIAGNOSIS — Z20822 Contact with and (suspected) exposure to covid-19: Secondary | ICD-10-CM | POA: Diagnosis not present

## 2019-08-03 LAB — SARS CORONAVIRUS 2 (TAT 6-24 HRS): SARS Coronavirus 2: NEGATIVE

## 2019-08-04 ENCOUNTER — Other Ambulatory Visit: Payer: Self-pay | Admitting: Surgery

## 2019-08-04 ENCOUNTER — Ambulatory Visit
Admission: RE | Admit: 2019-08-04 | Discharge: 2019-08-04 | Disposition: A | Discharge status: Home / Self Care | Payer: 59 | Source: Ambulatory Visit | Attending: Surgery | Admitting: Surgery

## 2019-08-04 DIAGNOSIS — Z791 Long term (current) use of non-steroidal anti-inflammatories (NSAID): Secondary | ICD-10-CM | POA: Diagnosis not present

## 2019-08-04 DIAGNOSIS — Z171 Estrogen receptor negative status [ER-]: Secondary | ICD-10-CM | POA: Diagnosis not present

## 2019-08-04 DIAGNOSIS — C50412 Malignant neoplasm of upper-outer quadrant of left female breast: Secondary | ICD-10-CM | POA: Diagnosis present

## 2019-08-04 DIAGNOSIS — Z79899 Other long term (current) drug therapy: Secondary | ICD-10-CM | POA: Diagnosis not present

## 2019-08-04 DIAGNOSIS — Z9221 Personal history of antineoplastic chemotherapy: Secondary | ICD-10-CM | POA: Diagnosis not present

## 2019-08-04 DIAGNOSIS — Z888 Allergy status to other drugs, medicaments and biological substances status: Secondary | ICD-10-CM | POA: Diagnosis not present

## 2019-08-04 DIAGNOSIS — Z91048 Other nonmedicinal substance allergy status: Secondary | ICD-10-CM | POA: Diagnosis not present

## 2019-08-04 DIAGNOSIS — N6049 Mammary duct ectasia of unspecified breast: Secondary | ICD-10-CM | POA: Diagnosis not present

## 2019-08-05 ENCOUNTER — Other Ambulatory Visit: Payer: Self-pay

## 2019-08-05 ENCOUNTER — Ambulatory Visit: Payer: 59 | Admitting: Anesthesiology

## 2019-08-05 ENCOUNTER — Encounter: Payer: Self-pay | Admitting: Surgery

## 2019-08-05 ENCOUNTER — Ambulatory Visit
Admission: RE | Admit: 2019-08-05 | Discharge: 2019-08-05 | Disposition: A | Discharge status: Home / Self Care | Payer: 59 | Source: Ambulatory Visit | Attending: Surgery | Admitting: Surgery

## 2019-08-05 ENCOUNTER — Ambulatory Visit
Admission: RE | Admit: 2019-08-05 | Discharge: 2019-08-05 | Disposition: A | Discharge status: Home / Self Care | Payer: 59 | Attending: Surgery | Admitting: Surgery

## 2019-08-05 ENCOUNTER — Encounter
Admission: RE | Disposition: A | Discharge status: Home / Self Care | Payer: Self-pay | Source: Home / Self Care | Attending: Surgery

## 2019-08-05 DIAGNOSIS — C50912 Malignant neoplasm of unspecified site of left female breast: Secondary | ICD-10-CM

## 2019-08-05 DIAGNOSIS — Z791 Long term (current) use of non-steroidal anti-inflammatories (NSAID): Secondary | ICD-10-CM | POA: Insufficient documentation

## 2019-08-05 DIAGNOSIS — C50412 Malignant neoplasm of upper-outer quadrant of left female breast: Secondary | ICD-10-CM

## 2019-08-05 DIAGNOSIS — Z171 Estrogen receptor negative status [ER-]: Secondary | ICD-10-CM | POA: Insufficient documentation

## 2019-08-05 DIAGNOSIS — Z888 Allergy status to other drugs, medicaments and biological substances status: Secondary | ICD-10-CM | POA: Insufficient documentation

## 2019-08-05 DIAGNOSIS — Z170 Estrogen receptor positive status [ER+]: Secondary | ICD-10-CM

## 2019-08-05 DIAGNOSIS — Z79899 Other long term (current) drug therapy: Secondary | ICD-10-CM | POA: Insufficient documentation

## 2019-08-05 DIAGNOSIS — Z91048 Other nonmedicinal substance allergy status: Secondary | ICD-10-CM | POA: Insufficient documentation

## 2019-08-05 DIAGNOSIS — N6049 Mammary duct ectasia of unspecified breast: Secondary | ICD-10-CM | POA: Insufficient documentation

## 2019-08-05 DIAGNOSIS — Z9221 Personal history of antineoplastic chemotherapy: Secondary | ICD-10-CM | POA: Insufficient documentation

## 2019-08-05 HISTORY — PX: BREAST LUMPECTOMY: SHX2

## 2019-08-05 HISTORY — PX: PARTIAL MASTECTOMY WITH NEEDLE LOCALIZATION AND AXILLARY SENTINEL LYMPH NODE BX: SHX6009

## 2019-08-05 SURGERY — PARTIAL MASTECTOMY WITH NEEDLE LOCALIZATION AND AXILLARY SENTINEL LYMPH NODE BX
Anesthesia: General | Site: Breast | Laterality: Left

## 2019-08-05 MED ORDER — LIDOCAINE HCL (CARDIAC) PF 100 MG/5ML IV SOSY
PREFILLED_SYRINGE | INTRAVENOUS | Status: DC | PRN
  Administered 2019-08-05: 100 mg via INTRAVENOUS

## 2019-08-05 MED ORDER — CHLORHEXIDINE GLUCONATE CLOTH 2 % EX PADS: 6.0000 | MEDICATED_PAD | Freq: Once | CUTANEOUS | Status: DC

## 2019-08-05 MED ORDER — TECHNETIUM TC 99M SULFUR COLLOID FILTERED
0.6810 | Freq: Once | INTRAVENOUS | Status: AC | PRN
  Administered 2019-08-05: 08:00:00 0.681 via INTRADERMAL

## 2019-08-05 MED ORDER — FAMOTIDINE 20 MG PO TABS: 20.0000 mg | ORAL_TABLET | Freq: Once | ORAL | Status: AC

## 2019-08-05 MED ORDER — PROMETHAZINE HCL 25 MG/ML IJ SOLN: 6.2500 mg | INTRAMUSCULAR | Status: DC | PRN

## 2019-08-05 MED ORDER — EPINEPHRINE PF 1 MG/ML IJ SOLN
INTRAMUSCULAR | Status: AC
  Filled 2019-08-05: qty 1

## 2019-08-05 MED ORDER — CEFAZOLIN SODIUM-DEXTROSE 2-4 GM/100ML-% IV SOLN
INTRAVENOUS | Status: AC
  Filled 2019-08-05: qty 100

## 2019-08-05 MED ORDER — LIDOCAINE HCL (PF) 1 % IJ SOLN
INTRAMUSCULAR | Status: DC | PRN
  Administered 2019-08-05: 12 mL

## 2019-08-05 MED ORDER — CEFAZOLIN SODIUM-DEXTROSE 2-4 GM/100ML-% IV SOLN
2.0000 g | INTRAVENOUS | Status: AC
  Administered 2019-08-05: 2 g via INTRAVENOUS

## 2019-08-05 MED ORDER — MEPERIDINE HCL 50 MG/ML IJ SOLN: 6.2500 mg | INTRAMUSCULAR | Status: DC | PRN

## 2019-08-05 MED ORDER — ACETAMINOPHEN 325 MG PO TABS: 650.0000 mg | ORAL_TABLET | Freq: Three times a day (TID) | ORAL | 0 refills | Status: AC | PRN

## 2019-08-05 MED ORDER — DOCUSATE SODIUM 100 MG PO CAPS: 100.0000 mg | ORAL_CAPSULE | Freq: Two times a day (BID) | ORAL | 0 refills | Status: AC | PRN

## 2019-08-05 MED ORDER — DEXAMETHASONE SODIUM PHOSPHATE 10 MG/ML IJ SOLN
INTRAMUSCULAR | Status: DC | PRN
  Administered 2019-08-05: 10 mg via INTRAVENOUS

## 2019-08-05 MED ORDER — ACETAMINOPHEN 10 MG/ML IV SOLN
INTRAVENOUS | Status: DC | PRN
  Administered 2019-08-05: 1000 mg via INTRAVENOUS

## 2019-08-05 MED ORDER — ONDANSETRON HCL 4 MG/2ML IJ SOLN
INTRAMUSCULAR | Status: DC | PRN
  Administered 2019-08-05: 4 mg via INTRAVENOUS

## 2019-08-05 MED ORDER — PROPOFOL 10 MG/ML IV BOLUS
INTRAVENOUS | Status: DC | PRN
  Administered 2019-08-05: 200 mg via INTRAVENOUS

## 2019-08-05 MED ORDER — ACETAMINOPHEN 10 MG/ML IV SOLN: 1000.0000 mg | Freq: Once | INTRAVENOUS | Status: DC | PRN

## 2019-08-05 MED ORDER — DEXMEDETOMIDINE HCL IN NACL 200 MCG/50ML IV SOLN
INTRAVENOUS | Status: DC | PRN
  Administered 2019-08-05: 16 ug via INTRAVENOUS
  Administered 2019-08-05: 12 ug via INTRAVENOUS

## 2019-08-05 MED ORDER — IBUPROFEN 800 MG PO TABS: 800.0000 mg | ORAL_TABLET | Freq: Three times a day (TID) | ORAL | 0 refills | Status: DC | PRN

## 2019-08-05 MED ORDER — BUPIVACAINE HCL (PF) 0.5 % IJ SOLN
INTRAMUSCULAR | Status: AC
  Filled 2019-08-05: qty 30

## 2019-08-05 MED ORDER — HYDROCODONE-ACETAMINOPHEN 5-325 MG PO TABS: 1.0000 | ORAL_TABLET | Freq: Four times a day (QID) | ORAL | 0 refills | Status: DC | PRN

## 2019-08-05 MED ORDER — MIDAZOLAM HCL 2 MG/2ML IJ SOLN
INTRAMUSCULAR | Status: AC
  Filled 2019-08-05: qty 2

## 2019-08-05 MED ORDER — LACTATED RINGERS IV SOLN: INTRAVENOUS | Status: DC

## 2019-08-05 MED ORDER — BUPIVACAINE-EPINEPHRINE 0.5% -1:200000 IJ SOLN
INTRAMUSCULAR | Status: DC | PRN
  Administered 2019-08-05: 12 mL

## 2019-08-05 MED ORDER — LACTATED RINGERS IV SOLN: INTRAVENOUS | Status: DC | PRN

## 2019-08-05 MED ORDER — MIDAZOLAM HCL 2 MG/2ML IJ SOLN
INTRAMUSCULAR | Status: DC | PRN
  Administered 2019-08-05: 2 mg via INTRAVENOUS

## 2019-08-05 MED ORDER — FENTANYL CITRATE (PF) 100 MCG/2ML IJ SOLN
INTRAMUSCULAR | Status: DC | PRN
  Administered 2019-08-05 (×2): 50 ug via INTRAVENOUS

## 2019-08-05 MED ORDER — FENTANYL CITRATE (PF) 100 MCG/2ML IJ SOLN: 25.0000 ug | INTRAMUSCULAR | Status: DC | PRN

## 2019-08-05 MED ORDER — HYDROCODONE-ACETAMINOPHEN 7.5-325 MG PO TABS: 1.0000 | ORAL_TABLET | Freq: Once | ORAL | Status: DC | PRN

## 2019-08-05 MED ORDER — ACETAMINOPHEN 160 MG/5ML PO SOLN
325.0000 mg | ORAL | Status: DC | PRN
  Filled 2019-08-05: qty 20.3

## 2019-08-05 MED ORDER — FAMOTIDINE 20 MG PO TABS
ORAL_TABLET | ORAL | Status: AC
  Administered 2019-08-05: 20 mg via ORAL
  Filled 2019-08-05: qty 1

## 2019-08-05 MED ORDER — EPHEDRINE SULFATE 50 MG/ML IJ SOLN
INTRAMUSCULAR | Status: DC | PRN
  Administered 2019-08-05: 10 mg via INTRAVENOUS

## 2019-08-05 MED ORDER — ACETAMINOPHEN 325 MG PO TABS: 325.0000 mg | ORAL_TABLET | ORAL | Status: DC | PRN

## 2019-08-05 MED ORDER — FENTANYL CITRATE (PF) 100 MCG/2ML IJ SOLN
INTRAMUSCULAR | Status: AC
  Filled 2019-08-05: qty 2

## 2019-08-05 MED ORDER — LIDOCAINE HCL (PF) 1 % IJ SOLN
INTRAMUSCULAR | Status: AC
  Filled 2019-08-05: qty 30

## 2019-08-05 MED ORDER — ACETAMINOPHEN 10 MG/ML IV SOLN
INTRAVENOUS | Status: AC
  Filled 2019-08-05: qty 100

## 2019-08-05 SURGICAL SUPPLY — 51 items
ADH SKN CLS APL DERMABOND .7 (GAUZE/BANDAGES/DRESSINGS) ×1
APL PRP STRL LF DISP 70% ISPRP (MISCELLANEOUS) ×1
APPLIER CLIP 11 MED OPEN (CLIP)
APR CLP MED 11 20 MLT OPN (CLIP)
BLADE SURG 15 STRL LF DISP TIS (BLADE) ×1 IMPLANT
BLADE SURG 15 STRL SS (BLADE) ×3
CANISTER SUCT 1200ML W/VALVE (MISCELLANEOUS) ×3 IMPLANT
CHLORAPREP W/TINT 26 (MISCELLANEOUS) ×3 IMPLANT
CLIP APPLIE 11 MED OPEN (CLIP) IMPLANT
CNTNR SPEC 2.5X3XGRAD LEK (MISCELLANEOUS)
CONT SPEC 4OZ STER OR WHT (MISCELLANEOUS)
CONT SPEC 4OZ STRL OR WHT (MISCELLANEOUS)
CONTAINER SPEC 2.5X3XGRAD LEK (MISCELLANEOUS) IMPLANT
COVER WAND RF STERILE (DRAPES) ×3 IMPLANT
DERMABOND ADVANCED (GAUZE/BANDAGES/DRESSINGS) ×2
DERMABOND ADVANCED .7 DNX12 (GAUZE/BANDAGES/DRESSINGS) ×1 IMPLANT
DEVICE DSSCT PLSMBLD 3.0S LGHT (MISCELLANEOUS) ×1 IMPLANT
DEVICE DUBIN SPECIMEN MAMMOGRA (MISCELLANEOUS) ×3 IMPLANT
DRAPE LAPAROTOMY TRNSV 106X77 (MISCELLANEOUS) ×3 IMPLANT
ELECT CAUTERY BLADE TIP 2.5 (TIP) ×3
ELECT REM PT RETURN 9FT ADLT (ELECTROSURGICAL) ×3
ELECTRODE CAUTERY BLDE TIP 2.5 (TIP) ×1 IMPLANT
ELECTRODE REM PT RTRN 9FT ADLT (ELECTROSURGICAL) ×1 IMPLANT
GLOVE BIOGEL PI IND STRL 7.0 (GLOVE) ×1 IMPLANT
GLOVE BIOGEL PI INDICATOR 7.0 (GLOVE) ×2
GLOVE SURG SYN 6.5 ES PF (GLOVE) ×3 IMPLANT
GOWN STRL REUS W/ TWL LRG LVL3 (GOWN DISPOSABLE) ×3 IMPLANT
GOWN STRL REUS W/TWL LRG LVL3 (GOWN DISPOSABLE) ×9
JACKSON PRATT 10 (INSTRUMENTS) IMPLANT
KIT MARKER MARGIN INK (KITS) ×3 IMPLANT
KIT TURNOVER KIT A (KITS) ×3 IMPLANT
LABEL OR SOLS (LABEL) ×3 IMPLANT
LIGHT WAVEGUIDE WIDE FLAT (MISCELLANEOUS) IMPLANT
MARKER MARGIN CORRECT CLIP (MARKER) ×3 IMPLANT
NEEDLE HYPO 22GX1.5 SAFETY (NEEDLE) ×6 IMPLANT
PACK BASIN MINOR ARMC (MISCELLANEOUS) ×3 IMPLANT
PLASMABLADE 3.0S W/LIGHT (MISCELLANEOUS) ×3
SET LOCALIZER 20 PROBE US (MISCELLANEOUS) ×3 IMPLANT
SLEVE PROBE SENORX GAMMA FIND (MISCELLANEOUS) ×3 IMPLANT
SUT MNCRL 4-0 (SUTURE) ×6
SUT MNCRL 4-0 27XMFL (SUTURE) ×2
SUT SILK 2 0 (SUTURE)
SUT SILK 2-0 30XBRD TIE 12 (SUTURE) IMPLANT
SUT SILK 3 0 12 30 (SUTURE) IMPLANT
SUT VIC AB 3-0 SH 27 (SUTURE) ×6
SUT VIC AB 3-0 SH 27X BRD (SUTURE) ×2 IMPLANT
SUTURE MNCRL 4-0 27XMF (SUTURE) ×2 IMPLANT
SYR 20ML LL LF (SYRINGE) ×3 IMPLANT
TUBING CONNECTING 10 (TUBING) ×2 IMPLANT
TUBING CONNECTING 10' (TUBING) ×1
WATER STERILE IRR 1000ML POUR (IV SOLUTION) ×3 IMPLANT

## 2019-08-05 NOTE — Discharge Instructions (Addendum)
Removal, Care After This sheet gives you information about how to care for yourself after your procedure. Your health care provider may also give you more specific instructions. If you have problems or questions, contact your health care provider. What can I expect after the procedure? After the procedure, it is common to have:  Soreness.  Bruising.  Itching. Follow these instructions at home: site care Follow instructions from your health care provider about how to take care of your site. Make sure you:  Wash your hands with soap and water before and after you change your bandage (dressing). If soap and water are not available, use hand sanitizer.  Leave stitches (sutures), skin glue, or adhesive strips in place. These skin closures may need to stay in place for 2 weeks or longer. If adhesive strip edges start to loosen and curl up, you may trim the loose edges. Do not remove adhesive strips completely unless your health care provider tells you to do that.  If the area bleeds or bruises, apply gentle pressure for 10 minutes.  OK TO SHOWER IN 24HRS  Check your site every day for signs of infection. Check for:  Redness, swelling, or pain.  Fluid or blood.  Warmth.  Pus or a bad smell.  General instructions  Rest and then return to your normal activities as told by your health care provider. .  tylenol and advil as needed for discomfort.  Please alternate between the two every four hours as needed for pain.   .  Use narcotics, if prescribed, only when tylenol and motrin is not enough to control pain. .  325-650mg every 8hrs to max of 3000mg/24hrs (including the 325mg in every norco dose) for the tylenol.   .  Advil up to 800mg per dose every 8hrs as needed for pain.    Keep all follow-up visits as told by your health care provider. This is important. Contact a health care provider if:  You have redness, swelling, or pain around your site.  You have fluid or blood coming  from your site.  Your site feels warm to the touch.  You have pus or a bad smell coming from your site.  You have a fever.  Your sutures, skin glue, or adhesive strips loosen or come off sooner than expected. Get help right away if:  You have bleeding that does not stop with pressure or a dressing. Summary  After the procedure, it is common to have some soreness, bruising, and itching at the site.  Follow instructions from your health care provider about how to take care of your site.  Check your site every day for signs of infection.  Contact a health care provider if you have redness, swelling, or pain around your site, or your site feels warm to the touch.  Keep all follow-up visits as told by your health care provider. This is important. This information is not intended to replace advice given to you by your health care provider. Make sure you discuss any questions you have with your health care provider. Document Released: 05/12/2015 Document Revised: 10/13/2017 Document Reviewed: 10/13/2017 Elsevier Interactive Patient Education  2019 [employer]  AMBULATORY SURGERY  DISCHARGE INSTRUCTIONS   1) The drugs that you were given will stay in your system until tomorrow so for the next 24 hours you should not:  A) Drive an automobile B) Make any legal decisions C) Drink any alcoholic beverage   2) You may resume regular meals tomorrow.  Today it   is better to start with liquids and gradually work up to solid foods.  You may eat anything you prefer, but it is better to start with liquids, then soup and crackers, and gradually work up to solid foods.   3) Please notify your doctor immediately if you have any unusual bleeding, trouble breathing, redness and pain at the surgery site, drainage, fever, or pain not relieved by medication.    4) Additional Instructions:    Please contact your physician with any problems or Same Day Surgery at [phone], Monday through  Friday 6 am to 4 pm, or [facility] Main number at [phone].    

## 2019-08-05 NOTE — Transfer of Care (Signed)
Immediate Anesthesia Transfer of Care Note  Patient: [name]  Procedure(s) Performed: PARTIAL MASTECTOMY WITH RF TAG AND AXILLARY SENTINEL LYMPH NODE BX (Left Breast)  Patient Location: PACU  Anesthesia Type:General  Level of Consciousness: sedated  Airway & Oxygen Therapy: Patient Spontanous Breathing and Patient connected to nasal cannula oxygen  Post-op Assessment: Report given to RN and Post -op Vital signs reviewed and stable  Post vital signs: Reviewed and stable  Last Vitals:  Vitals Value Taken Time  BP 114/74 08/05/19 1044  Temp 36.7 C 08/05/19 1044  Pulse 82 08/05/19 1046  Resp 14 08/05/19 1046  SpO2 100 % 08/05/19 1046  Vitals shown include unvalidated device data.  Last Pain:  Vitals:   08/05/19 0819  TempSrc: Temporal  PainSc: 0-No pain         Complications: none

## 2019-08-05 NOTE — Anesthesia Procedure Notes (Signed)
Procedure Name: LMA Insertion Date/Time: 08/05/2019 9:12 AM Performed by: Justus Memory, CRNA Pre-anesthesia Checklist: Patient identified, Patient being monitored, Timeout performed, Emergency Drugs available and Suction available Patient Re-evaluated:Patient Re-evaluated prior to induction Oxygen Delivery Method: Circle system utilized Preoxygenation: Pre-oxygenation with 100% oxygen Induction Type: IV induction Ventilation: Mask ventilation without difficulty LMA: LMA inserted LMA Size: 3.5 Tube type: Oral Number of attempts: 1 Placement Confirmation: positive ETCO2 and breath sounds checked- equal and bilateral Tube secured with: Tape Dental Injury: Teeth and Oropharynx as per pre-operative assessment
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
## 2019-08-05 NOTE — Anesthesia Postprocedure Evaluation (Signed)
Anesthesia Post Note  Patient: [name]  Procedure(s) Performed: PARTIAL MASTECTOMY WITH RF TAG AND AXILLARY SENTINEL LYMPH NODE BX (Left Breast)  Patient location during evaluation: PACU Anesthesia Type: General Level of consciousness: awake and alert Pain management: pain level controlled Vital Signs Assessment: post-procedure vital signs reviewed and stable Respiratory status: spontaneous breathing, nonlabored ventilation and respiratory function stable Cardiovascular status: blood pressure returned to baseline and stable Postop Assessment: no apparent nausea or vomiting Anesthetic complications: no     Last Vitals:  Vitals:   08/05/19 1124 08/05/19 1132  BP: 108/79 112/72  Pulse: 80 80  Resp: (!) 24 12  Temp:  (!) 36.2 C  SpO2: 97% 95%    Last Pain:  Vitals:   08/05/19 1132  TempSrc: Temporal  PainSc: 0-No pain                 Alphonsus Sias

## 2019-08-05 NOTE — Interval H&P Note (Signed)
History and Physical Interval Note:  08/05/2019 8:41 AM  [name]  has presented today for surgery, with the diagnosis of C50.412 Malignant neoplasm of upper-outer quadrant of left breast, unspecified estrogen receptor status.  The various methods of treatment have been discussed with the patient and family. After consideration of risks, benefits and other options for treatment, the patient has consented to  Procedure(s): PARTIAL MASTECTOMY WITH RF TAG AND AXILLARY SENTINEL LYMPH NODE BX (Left) as a surgical intervention.  The patient's history has been reviewed, patient examined, no change in status, stable for surgery.  I have reviewed the patient's chart and labs.  Questions were answered to the patient's satisfaction.     Khushboo Chuck Lysle Pearl

## 2019-08-06 NOTE — Op Note (Signed)
Preoperative diagnosis:  Left multifocal breast carcinoma.  Postoperative diagnosis: same.   Procedure: RF localized Left multifocal  breast partial mastectomy.                      left Axillary Sentinel Lymph node biopsy  Anesthesia: GETA  Surgeon: Dr. Benjamine Sprague  Wound Classification: Clean  Indications: Patient is a 49 y.o. female with a nonpalpable left breast mass noted on mammography with core biopsy demonstrating cancer requires needle-localized partial mastectomy for treatment with sentinel lymph node biopsy.   Specimen: left Breast mass, Sentinel Lymph nodes x 1, anterior margin, posterior margin  Complications: None  Estimated Blood Loss: 41mL  Findings: 1. Specimen mammography shows marker and wire on specimen 2. Pathology call refers gross examination of margins was positive at anterior and posterior, so additional margins taken. 3. No other palpable mass or lymph node identified.   Description of procedure: Preoperative RF localization was performed by radiology. In the nuclear medicine suite, the subareolar region was injected with Tc-99 sulfur colloid. Localization studies were reviewed. The patient was taken to the operating room and placed supine on the operating table, and after general anesthesia the left breast and axilla were prepped and draped in the usual sterile fashion. A time-out was completed verifying correct patient, procedure, site, positioning, and implant(s) and/or special equipment prior to beginning this procedure.  A skin incision was planned in such a way as to minimize the amount of dissection to reach the mass.  The skin incision was made after infusion of local. Flaps were raised and sharp and blunt dissection was then taken down to the mass, taking care to include the entire area of concern, which was the RF tag, and two biopsy clips surrounding it. The specimen was removed. The specimen was oriented with paint and sent to radiology with the  localization studies. Pathology call refers gross examination of margins was positive at anterior and posterior, so additional margins taken, marked old margin aspect, and then sent to pathology.  A hand-held gamma probe was used to identify the location of the hottest spot in the axilla. An incision was made around the caudal axillary hairline. Sharp and blunt Dissection was carried down to subdermal facias. The probe was placed within wound and again, the point of maximal count was found. Dissection continue until nodule was identified. The probe was placed in contact with the node and 3000 counts were recorded. The node was excised in its entirety. Ex vivo, the node measured 3000 counts when placed on the probe. The bed of the node measured <100 counts. No additional hot spots were identified. No clinically abnormal nodes were palpated. Both wounds irrigated, hemostasis was achieved and the wound closed in layers with  interrupted sutures of 3-0 Vicryl in deep dermal layer and a running subcuticular suture of Monocryl 4-0, then dressed with dermabond. The patient tolerated the procedure well and was taken to the postanesthesia care unit in stable condition. Sponge and instrument count correct at end of procedure.

## 2019-08-09 ENCOUNTER — Ambulatory Visit: Payer: 59 | Attending: Internal Medicine

## 2019-08-09 DIAGNOSIS — Z23 Encounter for immunization: Secondary | ICD-10-CM

## 2019-08-09 NOTE — Progress Notes (Signed)
   Covid-19 Vaccination Clinic  Name:  [name]    MRN: [id number] DOB: 10/27/1970  08/09/2019  [name] was observed post Covid-19 immunization for 15 minutes without incident. She was provided with Vaccine Information Sheet and instruction to access the V-Safe system.   [name] was instructed to call 911 with any severe reactions post vaccine: Marland Kitchen Difficulty breathing  . Swelling of face and throat  . A fast heartbeat  . A bad rash all over body  . Dizziness and weakness   Immunizations Administered    Name Date Dose VIS Date Route   Pfizer COVID-19 Vaccine 08/09/2019 12:57 PM 0.3 mL 04/09/2019 Intramuscular   Manufacturer: [place]   Lot: [id number]   [place]: [id number]

## 2019-08-10 ENCOUNTER — Telehealth: Payer: Self-pay | Admitting: Internal Medicine

## 2019-08-10 LAB — SURGICAL PATHOLOGY

## 2019-08-10 NOTE — Telephone Encounter (Signed)
On 4/13-spoke to patient regarding pathology results post surgery.  Patient to follow-up as planned

## 2019-08-11 ENCOUNTER — Ambulatory Visit: Payer: 59

## 2019-08-20 ENCOUNTER — Inpatient Hospital Stay: Payer: 59 | Attending: Internal Medicine | Admitting: Internal Medicine

## 2019-08-20 ENCOUNTER — Other Ambulatory Visit: Payer: Self-pay

## 2019-08-20 ENCOUNTER — Encounter: Payer: Self-pay | Admitting: Internal Medicine

## 2019-08-20 DIAGNOSIS — F17210 Nicotine dependence, cigarettes, uncomplicated: Secondary | ICD-10-CM

## 2019-08-20 DIAGNOSIS — Z171 Estrogen receptor negative status [ER-]: Secondary | ICD-10-CM

## 2019-08-20 DIAGNOSIS — Z9221 Personal history of antineoplastic chemotherapy: Secondary | ICD-10-CM | POA: Diagnosis not present

## 2019-08-20 DIAGNOSIS — C50812 Malignant neoplasm of overlapping sites of left female breast: Secondary | ICD-10-CM

## 2019-08-20 DIAGNOSIS — R530 Neoplastic (malignant) related fatigue: Secondary | ICD-10-CM | POA: Diagnosis not present

## 2019-08-20 DIAGNOSIS — Z006 Encounter for examination for normal comparison and control in clinical research program: Secondary | ICD-10-CM

## 2019-08-20 NOTE — Assessment & Plan Note (Addendum)
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  Currently status post neoadjuvant chemotherapy [TCH+P]; s/p cycle #6-status post lumpectomy shows complete pathologic response.  Discussed the excellent results with the patient/extremely good prognosis noted.   #Recommend proceeding with Herceptin-Perjeta every 3 weeks for total of 1 year.  Will get MUGA scan at next visit.  Discussed the role of postlumpectomy radiation.  We will make a referral to Dr. Donella Stade.  # Fatigue grade 1-from chemotherapy-stable overall improved.  # PN-[clinical trial]-grade 1.  Stable.  # DISPOSITION:  # referral to Dr.Crystal Dx; breast cancer # follow up 2 week MD- labs-cbc/cmp; Perjeta-Herceptin-Dr.B

## 2019-08-20 NOTE — Research (Addendum)
SWOG [id number] 24 Week Protocol Visit:   Patient in to cancer center unaccompanied for her scheduled Week 24 visit with Dr. Rogue Bussing. Solicited AE's reviewed by Dr. 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 Dr. Rogue Bussing completed the Follow-Up Physician Assessment for toxicity burden related to the affects on her daily life with a score of 0 (range from 0-10, 10 is the most severe). The patient states she has no other symptoms related to her chemotherapy.  The patient completed her QOL questionnaires prior to meeting with Dr. Rogue Bussing. The neuropen, tuning fork and timed get up and go assessments were completed as well with assistance from Yolande Jolly, Therapist, sports. Patient was relieved that she wouldn't be ringing the bell today as her son didn't come with her.The timeline for the protocol was reviewed and the next assessment for protocol will be for a 52 week visit. Explained to the patient that we would try and complete that visit with her regularly scheduled appointments for continued Herceptin / Perjeta / radiation treatments.   Jeral Fruit, RN, BSN, OCN 08/20/2019 1430 pm     Adverse Event Log  Study/Protocol: SWOG [id number] Cycle: 3 , Week 8  Event Grade Onset Date Resolved Date Drug Name Attribution Treatment Comments  Sensory Peripheral Neuropathy 1 05/03/2019  Taxol Definitely    Constipation 1 05/03/2019 08/20/2019 Taxol Possible    Pain- abdominal 1 05/03/2019 08/20/2019  Possible    Pain 1 08/20/2019   Unrelated Tylenol Post Lumpectomy   Fatigue 1 08/20/2019  Tacol Definately
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
## 2019-08-20 NOTE — Progress Notes (Signed)
DISCONTINUE ON PATHWAY REGIMEN - Breast     A cycle is every 21 days:     Pertuzumab      Pertuzumab      Trastuzumab-xxxx      Trastuzumab-xxxx      Carboplatin      Docetaxel   **Always confirm dose/schedule in your pharmacy ordering system**  REASON: Other Reason PRIOR TREATMENT: [id number]: Docetaxel + Carboplatin + Trastuzumab + Pertuzumab (TCHP) q21 Days x 6 Cycles TREATMENT RESPONSE: Complete Response (CR)  START ON PATHWAY REGIMEN - Breast     A cycle is every 21 days:     Trastuzumab-xxxx      Pertuzumab   **Always confirm dose/schedule in your pharmacy ordering system**  Patient Characteristics: Post-Neoadjuvant Therapy and Resection, HER2 Positive, ER Negative/Unknown, No Residual Disease, Adjuvant Targeted Therapy After Neoadjuvant Chemo/Targeted Therapy Therapeutic Status: Post-Neoadjuvant Therapy and Resection ER Status: Negative (-) HER2 Status: Positive (+) PR Status: Negative (-) Residual Invasive Disease Post-Neoadjuvant Therapy<= No Intent of Therapy: Curative Intent, Discussed with Patient

## 2019-08-27 NOTE — Progress Notes (Signed)
Pharmacist Chemotherapy Monitoring - Initial Assessment    Anticipated start date: 09/03/19  Regimen:  . Are orders appropriate based on the patient's diagnosis, regimen, and cycle? Yes . Does the plan date match the patient's scheduled date? Yes . Is the sequencing of drugs appropriate? Yes . Are the premedications appropriate for the patient's regimen? Yes . Prior Authorization for treatment is: Approved o If applicable, is the correct biosimilar selected based on the patient's insurance? yes  Organ Function and Labs: Marland Kitchen Are dose adjustments needed based on the patient's renal function, hepatic function, or hematologic function? No . Are appropriate labs ordered prior to the start of patient's treatment? Yes . Other organ system assessment, if indicated: trastuzumab: Echo/ MUGA and pertuzumab: Echo/ MUGA  - MD to order next visit . The following baseline labs, if indicated, have been ordered: N/A  Dose Assessment: . Are the drug doses appropriate? Yes . Are the following correct: o Drug concentrations Yes o IV fluid compatible with drug Yes o Administration routes Yes o Timing of therapy Yes . If applicable, does the patient have documented access for treatment and/or plans for port-a-cath placement? yes . If applicable, have lifetime cumulative doses been properly documented and assessed? yes Lifetime Dose Tracking  . Carboplatin: 3,060 mg = 0.01 % of the maximum lifetime dose of 999,999,999 mg  o   Toxicity Monitoring/Prevention: . The patient has the following take home antiemetics prescribed: Prochlorperazine . The patient has the following take home medications prescribed: N/A . Medication allergies and previous infusion related reactions, if applicable, have been reviewed and addressed. Yes . The patient's current medication list has been assessed for drug-drug interactions with their chemotherapy regimen. no significant drug-drug interactions were identified on  review.  Order Review: . Are the treatment plan orders signed? Yes . Is the patient scheduled to see a provider prior to their treatment? Yes  I verify that I have reviewed each item in the above checklist and answered each question accordingly.  Adelina Mings 08/27/2019 9:05 AM

## 2019-09-02 ENCOUNTER — Ambulatory Visit
Admission: RE | Admit: 2019-09-02 | Discharge: 2019-09-02 | Disposition: A | Discharge status: Home / Self Care | Payer: 59 | Source: Ambulatory Visit | Attending: Radiation Oncology | Admitting: Radiation Oncology

## 2019-09-02 ENCOUNTER — Other Ambulatory Visit: Payer: Self-pay

## 2019-09-02 VITALS — BP 124/81 | HR 74 | Temp 96.6°F | Resp 16 | Wt 118.0 lb

## 2019-09-02 DIAGNOSIS — C50812 Malignant neoplasm of overlapping sites of left female breast: Secondary | ICD-10-CM | POA: Insufficient documentation

## 2019-09-02 DIAGNOSIS — Z79899 Other long term (current) drug therapy: Secondary | ICD-10-CM | POA: Insufficient documentation

## 2019-09-02 DIAGNOSIS — Z171 Estrogen receptor negative status [ER-]: Secondary | ICD-10-CM | POA: Diagnosis not present

## 2019-09-02 DIAGNOSIS — Z803 Family history of malignant neoplasm of breast: Secondary | ICD-10-CM | POA: Insufficient documentation

## 2019-09-02 DIAGNOSIS — E785 Hyperlipidemia, unspecified: Secondary | ICD-10-CM | POA: Insufficient documentation

## 2019-09-02 DIAGNOSIS — F329 Major depressive disorder, single episode, unspecified: Secondary | ICD-10-CM | POA: Diagnosis not present

## 2019-09-02 DIAGNOSIS — F17210 Nicotine dependence, cigarettes, uncomplicated: Secondary | ICD-10-CM | POA: Diagnosis not present

## 2019-09-02 DIAGNOSIS — Z806 Family history of leukemia: Secondary | ICD-10-CM | POA: Insufficient documentation

## 2019-09-02 DIAGNOSIS — Z9221 Personal history of antineoplastic chemotherapy: Secondary | ICD-10-CM | POA: Diagnosis not present

## 2019-09-02 DIAGNOSIS — F419 Anxiety disorder, unspecified: Secondary | ICD-10-CM | POA: Insufficient documentation
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
## 2019-09-03 ENCOUNTER — Inpatient Hospital Stay (HOSPITAL_BASED_OUTPATIENT_CLINIC_OR_DEPARTMENT_OTHER): Payer: 59 | Admitting: Internal Medicine

## 2019-09-03 ENCOUNTER — Encounter: Payer: Self-pay | Admitting: Internal Medicine

## 2019-09-03 ENCOUNTER — Other Ambulatory Visit: Payer: Self-pay

## 2019-09-03 ENCOUNTER — Inpatient Hospital Stay: Payer: 59

## 2019-09-03 ENCOUNTER — Inpatient Hospital Stay: Payer: 59 | Attending: Internal Medicine

## 2019-09-03 DIAGNOSIS — C50812 Malignant neoplasm of overlapping sites of left female breast: Secondary | ICD-10-CM | POA: Diagnosis not present

## 2019-09-03 DIAGNOSIS — Z5112 Encounter for antineoplastic immunotherapy: Secondary | ICD-10-CM | POA: Diagnosis not present

## 2019-09-03 DIAGNOSIS — Z79899 Other long term (current) drug therapy: Secondary | ICD-10-CM | POA: Diagnosis not present

## 2019-09-03 DIAGNOSIS — Z171 Estrogen receptor negative status [ER-]: Secondary | ICD-10-CM | POA: Insufficient documentation

## 2019-09-03 LAB — COMPREHENSIVE METABOLIC PANEL
ALT: 11 U/L (ref 0–44)
AST: 19 U/L (ref 15–41)
Albumin: 3.8 g/dL (ref 3.5–5.0)
Alkaline Phosphatase: 68 U/L (ref 38–126)
Anion gap: 10 (ref 5–15)
BUN: 11 mg/dL (ref 6–20)
CO2: 26 mmol/L (ref 22–32)
Calcium: 8.7 mg/dL — ABNORMAL LOW (ref 8.9–10.3)
Chloride: 104 mmol/L (ref 98–111)
Creatinine, Ser: 0.65 mg/dL (ref 0.44–1.00)
GFR calc Af Amer: >60 mL/min (ref >60)
GFR calc non Af Amer: >60 mL/min (ref >60)
Glucose, Bld: 86 mg/dL (ref 70–99)
Potassium: 3.8 mmol/L (ref 3.5–5.1)
Sodium: 140 mmol/L (ref 135–145)
Total Bilirubin: 0.5 mg/dL (ref 0.3–1.2)
Total Protein: 7.1 g/dL (ref 6.5–8.1)

## 2019-09-03 LAB — CBC WITH DIFFERENTIAL/PLATELET
Abs Immature Granulocytes: 0.02 10*3/uL (ref 0.00–0.07)
Basophils Absolute: 0.1 10*3/uL (ref 0.0–0.1)
Basophils Relative: 1 %
Eosinophils Absolute: 0.1 10*3/uL (ref 0.0–0.5)
Eosinophils Relative: 1 %
HCT: 39.4 % (ref 36.0–46.0)
Hemoglobin: 13.6 g/dL (ref 12.0–15.0)
Immature Granulocytes: 0 %
Lymphocytes Relative: 45 %
Lymphs Abs: 3.2 10*3/uL (ref 0.7–4.0)
MCH: 35.9 pg — ABNORMAL HIGH (ref 26.0–34.0)
MCHC: 34.5 g/dL (ref 30.0–36.0)
MCV: 104 fL — ABNORMAL HIGH (ref 80.0–100.0)
Monocytes Absolute: 0.5 10*3/uL (ref 0.1–1.0)
Monocytes Relative: 6 %
Neutro Abs: 3.4 10*3/uL (ref 1.7–7.7)
Neutrophils Relative %: 47 %
Platelets: 277 10*3/uL (ref 150–400)
RBC: 3.79 MIL/uL — ABNORMAL LOW (ref 3.87–5.11)
RDW: 13 % (ref 11.5–15.5)
WBC: 7.2 10*3/uL (ref 4.0–10.5)
nRBC: 0 % (ref 0.0–0.2)

## 2019-09-03 MED ORDER — HEPARIN SOD (PORK) LOCK FLUSH 100 UNIT/ML IV SOLN
500.0000 [IU] | Freq: Once | INTRAVENOUS | Status: AC | PRN
  Administered 2019-09-03: 500 [IU]
  Filled 2019-09-03: qty 5

## 2019-09-03 MED ORDER — SODIUM CHLORIDE 0.9 % IV SOLN
840.0000 mg | Freq: Once | INTRAVENOUS | Status: AC
  Administered 2019-09-03: 13:00:00 840 mg via INTRAVENOUS
  Filled 2019-09-03: qty 28

## 2019-09-03 MED ORDER — DIPHENHYDRAMINE HCL 25 MG PO CAPS
50.0000 mg | ORAL_CAPSULE | Freq: Once | ORAL | Status: AC
  Administered 2019-09-03: 50 mg via ORAL
  Filled 2019-09-03: qty 2

## 2019-09-03 MED ORDER — SODIUM CHLORIDE 0.9 % IV SOLN
Freq: Once | INTRAVENOUS | Status: AC
  Filled 2019-09-03: qty 250

## 2019-09-03 MED ORDER — TRASTUZUMAB-ANNS CHEMO 150 MG IV SOLR
8.0000 mg/kg | Freq: Once | INTRAVENOUS | Status: AC
  Administered 2019-09-03: 420 mg via INTRAVENOUS
  Filled 2019-09-03: qty 20

## 2019-09-03 MED ORDER — ACETAMINOPHEN 325 MG PO TABS
650.0000 mg | ORAL_TABLET | Freq: Once | ORAL | Status: AC
  Administered 2019-09-03: 650 mg via ORAL
  Filled 2019-09-03: qty 2
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
## 2019-09-03 NOTE — Assessment & Plan Note (Addendum)
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  Currently status post neoadjuvant chemotherapy [TCH+P]; s/p cycle #6-status post lumpectomy shows complete pathologic response.   # proceed with Herceptin-Perjeta every 3 weeks for total of 1 year. Will plan MUGA scan prior to next visit. Labs today reviewed;  acceptable for treatment today.   # Fatigue grade 1-from chemotherapy- STABLE;   # PN-[clinical trial]-grade 1. STABLE.  # DISPOSITION:  # treatment today # MUGA scan in next 2 weeks # follow up on 5/27- MD;  labs-cbc/cmp; Perjeta-Herceptin-Dr.B

## 2019-09-07 ENCOUNTER — Ambulatory Visit: Payer: 59

## 2019-09-07 DIAGNOSIS — Z510 Encounter for antineoplastic radiation therapy: Secondary | ICD-10-CM | POA: Insufficient documentation

## 2019-09-07 DIAGNOSIS — C50212 Malignant neoplasm of upper-inner quadrant of left female breast: Secondary | ICD-10-CM | POA: Diagnosis not present

## 2019-09-13 DIAGNOSIS — Z510 Encounter for antineoplastic radiation therapy: Secondary | ICD-10-CM | POA: Diagnosis not present

## 2019-09-15 ENCOUNTER — Ambulatory Visit: Admission: RE | Admit: 2019-09-15 | Payer: 59 | Source: Ambulatory Visit

## 2019-09-15 DIAGNOSIS — Z510 Encounter for antineoplastic radiation therapy: Secondary | ICD-10-CM | POA: Diagnosis not present

## 2019-09-15 NOTE — Progress Notes (Signed)

## 2019-09-16 ENCOUNTER — Ambulatory Visit
Admission: RE | Admit: 2019-09-16 | Discharge: 2019-09-16 | Disposition: A | Discharge status: Home / Self Care | Payer: 59 | Source: Ambulatory Visit | Attending: Radiation Oncology | Admitting: Radiation Oncology

## 2019-09-16 DIAGNOSIS — Z510 Encounter for antineoplastic radiation therapy: Secondary | ICD-10-CM | POA: Diagnosis not present

## 2019-09-17 ENCOUNTER — Ambulatory Visit
Admission: RE | Admit: 2019-09-17 | Discharge: 2019-09-17 | Disposition: A | Discharge status: Home / Self Care | Payer: 59 | Source: Ambulatory Visit | Attending: Radiation Oncology | Admitting: Radiation Oncology

## 2019-09-17 DIAGNOSIS — Z510 Encounter for antineoplastic radiation therapy: Secondary | ICD-10-CM | POA: Diagnosis not present

## 2019-09-20 ENCOUNTER — Ambulatory Visit
Admission: RE | Admit: 2019-09-20 | Discharge: 2019-09-20 | Disposition: A | Discharge status: Home / Self Care | Payer: 59 | Source: Ambulatory Visit | Attending: Radiation Oncology | Admitting: Radiation Oncology

## 2019-09-20 DIAGNOSIS — Z510 Encounter for antineoplastic radiation therapy: Secondary | ICD-10-CM | POA: Diagnosis not present

## 2019-09-21 ENCOUNTER — Encounter
Admission: RE | Admit: 2019-09-21 | Discharge: 2019-09-21 | Disposition: A | Discharge status: Home / Self Care | Payer: 59 | Source: Ambulatory Visit | Attending: Internal Medicine | Admitting: Internal Medicine

## 2019-09-21 ENCOUNTER — Other Ambulatory Visit: Payer: Self-pay

## 2019-09-21 ENCOUNTER — Ambulatory Visit
Admission: RE | Admit: 2019-09-21 | Discharge: 2019-09-21 | Disposition: A | Discharge status: Home / Self Care | Payer: 59 | Source: Ambulatory Visit | Attending: Radiation Oncology | Admitting: Radiation Oncology

## 2019-09-21 DIAGNOSIS — Z171 Estrogen receptor negative status [ER-]: Secondary | ICD-10-CM | POA: Diagnosis not present

## 2019-09-21 DIAGNOSIS — C50812 Malignant neoplasm of overlapping sites of left female breast: Secondary | ICD-10-CM | POA: Insufficient documentation

## 2019-09-21 DIAGNOSIS — Z510 Encounter for antineoplastic radiation therapy: Secondary | ICD-10-CM | POA: Diagnosis not present

## 2019-09-21 MED ORDER — TECHNETIUM TC 99M-LABELED RED BLOOD CELLS IV KIT
20.0000 | PACK | Freq: Once | INTRAVENOUS | Status: AC | PRN
  Administered 2019-09-21: 20.681 via INTRAVENOUS

## 2019-09-22 ENCOUNTER — Inpatient Hospital Stay: Payer: 59

## 2019-09-22 ENCOUNTER — Encounter: Payer: Self-pay | Admitting: Internal Medicine

## 2019-09-22 ENCOUNTER — Inpatient Hospital Stay (HOSPITAL_BASED_OUTPATIENT_CLINIC_OR_DEPARTMENT_OTHER): Payer: 59 | Admitting: Internal Medicine

## 2019-09-22 ENCOUNTER — Ambulatory Visit: Admission: RE | Admit: 2019-09-22 | Payer: 59 | Source: Ambulatory Visit

## 2019-09-22 VITALS — BP 114/78 | HR 77 | Temp 97.9°F | Resp 20

## 2019-09-22 DIAGNOSIS — R630 Anorexia: Secondary | ICD-10-CM | POA: Diagnosis not present

## 2019-09-22 DIAGNOSIS — C50812 Malignant neoplasm of overlapping sites of left female breast: Secondary | ICD-10-CM

## 2019-09-22 DIAGNOSIS — Z510 Encounter for antineoplastic radiation therapy: Secondary | ICD-10-CM | POA: Diagnosis not present

## 2019-09-22 DIAGNOSIS — Z171 Estrogen receptor negative status [ER-]: Secondary | ICD-10-CM

## 2019-09-22 DIAGNOSIS — Z95828 Presence of other vascular implants and grafts: Secondary | ICD-10-CM

## 2019-09-22 DIAGNOSIS — Z5112 Encounter for antineoplastic immunotherapy: Secondary | ICD-10-CM | POA: Diagnosis not present

## 2019-09-22 LAB — COMPREHENSIVE METABOLIC PANEL
ALT: 11 U/L (ref 0–44)
AST: 20 U/L (ref 15–41)
Albumin: 3.9 g/dL (ref 3.5–5.0)
Alkaline Phosphatase: 69 U/L (ref 38–126)
Anion gap: 8 (ref 5–15)
BUN: 9 mg/dL (ref 6–20)
CO2: 26 mmol/L (ref 22–32)
Calcium: 8.9 mg/dL (ref 8.9–10.3)
Chloride: 102 mmol/L (ref 98–111)
Creatinine, Ser: 0.59 mg/dL (ref 0.44–1.00)
GFR calc Af Amer: >60 mL/min (ref >60)
GFR calc non Af Amer: >60 mL/min (ref >60)
Glucose, Bld: 101 mg/dL — ABNORMAL HIGH (ref 70–99)
Potassium: 3.6 mmol/L (ref 3.5–5.1)
Sodium: 136 mmol/L (ref 135–145)
Total Bilirubin: 0.5 mg/dL (ref 0.3–1.2)
Total Protein: 7.1 g/dL (ref 6.5–8.1)

## 2019-09-22 LAB — CBC WITH DIFFERENTIAL/PLATELET
Abs Immature Granulocytes: 0.02 10*3/uL (ref 0.00–0.07)
Basophils Absolute: 0 10*3/uL (ref 0.0–0.1)
Basophils Relative: 1 %
Eosinophils Absolute: 0.1 10*3/uL (ref 0.0–0.5)
Eosinophils Relative: 1 %
HCT: 39.8 % (ref 36.0–46.0)
Hemoglobin: 13.8 g/dL (ref 12.0–15.0)
Immature Granulocytes: 0 %
Lymphocytes Relative: 30 %
Lymphs Abs: 2.6 10*3/uL (ref 0.7–4.0)
MCH: 35.7 pg — ABNORMAL HIGH (ref 26.0–34.0)
MCHC: 34.7 g/dL (ref 30.0–36.0)
MCV: 102.8 fL — ABNORMAL HIGH (ref 80.0–100.0)
Monocytes Absolute: 0.6 10*3/uL (ref 0.1–1.0)
Monocytes Relative: 8 %
Neutro Abs: 5.2 10*3/uL (ref 1.7–7.7)
Neutrophils Relative %: 60 %
Platelets: 297 10*3/uL (ref 150–400)
RBC: 3.87 MIL/uL (ref 3.87–5.11)
RDW: 12.7 % (ref 11.5–15.5)
WBC: 8.6 10*3/uL (ref 4.0–10.5)
nRBC: 0 % (ref 0.0–0.2)

## 2019-09-22 MED ORDER — DIPHENHYDRAMINE HCL 25 MG PO CAPS
50.0000 mg | ORAL_CAPSULE | Freq: Once | ORAL | Status: AC
  Administered 2019-09-22: 50 mg via ORAL
  Filled 2019-09-22: qty 2

## 2019-09-22 MED ORDER — GABAPENTIN 100 MG PO CAPS
ORAL_CAPSULE | ORAL | 1 refills | Status: DC
Start: 2019-09-22 — End: 2019-11-03

## 2019-09-22 MED ORDER — TRASTUZUMAB-ANNS CHEMO 150 MG IV SOLR
300.0000 mg | Freq: Once | INTRAVENOUS | Status: AC
  Administered 2019-09-22: 300 mg via INTRAVENOUS
  Filled 2019-09-22: qty 14.29

## 2019-09-22 MED ORDER — SODIUM CHLORIDE 0.9% FLUSH
10.0000 mL | INTRAVENOUS | Status: DC | PRN
  Administered 2019-09-22: 10 mL via INTRAVENOUS
  Filled 2019-09-22: qty 10

## 2019-09-22 MED ORDER — HEPARIN SOD (PORK) LOCK FLUSH 100 UNIT/ML IV SOLN
INTRAVENOUS | Status: AC
  Filled 2019-09-22: qty 5

## 2019-09-22 MED ORDER — SODIUM CHLORIDE 0.9 % IV SOLN
Freq: Once | INTRAVENOUS | Status: AC
  Filled 2019-09-22: qty 250

## 2019-09-22 MED ORDER — HEPARIN SOD (PORK) LOCK FLUSH 100 UNIT/ML IV SOLN
500.0000 [IU] | Freq: Once | INTRAVENOUS | Status: AC | PRN
  Administered 2019-09-22: 500 [IU]
  Filled 2019-09-22: qty 5

## 2019-09-22 MED ORDER — ACETAMINOPHEN 325 MG PO TABS
650.0000 mg | ORAL_TABLET | Freq: Once | ORAL | Status: AC
  Administered 2019-09-22: 650 mg via ORAL
  Filled 2019-09-22: qty 2

## 2019-09-22 MED ORDER — SODIUM CHLORIDE 0.9 % IV SOLN
420.0000 mg | Freq: Once | INTRAVENOUS | Status: AC
  Administered 2019-09-22: 420 mg via INTRAVENOUS
  Filled 2019-09-22: qty 14

## 2019-09-22 NOTE — Assessment & Plan Note (Addendum)
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  Currently status post neoadjuvant chemotherapy [TCH+P]; s/p cycle #6-status post lumpectomy shows complete pathologic response.   # Proceed with mainatence Herceptin-Perjeta. Labs today reviewed;  acceptable for treatment today. MUGA may 2021- 52% down from 57 % in Oct 2020.   # PN-[clinical trial]-grade-2-3 [more on left UE-?  Recent wrist fracture]- Trial of gabapentin-we will ramp up slowly.  # DISPOSITION:  # treatment today # follow up in 3 weeks MD;  labs-cbc/cmp; Perjeta-Herceptin-Dr.B
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
## 2019-09-23 ENCOUNTER — Ambulatory Visit: Payer: 59

## 2019-09-23 ENCOUNTER — Other Ambulatory Visit: Payer: 59

## 2019-09-23 ENCOUNTER — Ambulatory Visit
Admission: RE | Admit: 2019-09-23 | Discharge: 2019-09-23 | Disposition: A | Discharge status: Home / Self Care | Payer: 59 | Source: Ambulatory Visit | Attending: Radiation Oncology | Admitting: Radiation Oncology

## 2019-09-23 ENCOUNTER — Ambulatory Visit: Payer: 59 | Admitting: Internal Medicine

## 2019-09-23 DIAGNOSIS — Z510 Encounter for antineoplastic radiation therapy: Secondary | ICD-10-CM | POA: Diagnosis not present

## 2019-09-24 ENCOUNTER — Ambulatory Visit
Admission: RE | Admit: 2019-09-24 | Discharge: 2019-09-24 | Disposition: A | Discharge status: Home / Self Care | Payer: 59 | Source: Ambulatory Visit | Attending: Radiation Oncology | Admitting: Radiation Oncology

## 2019-09-24 DIAGNOSIS — Z510 Encounter for antineoplastic radiation therapy: Secondary | ICD-10-CM | POA: Diagnosis not present

## 2019-09-28 ENCOUNTER — Ambulatory Visit
Admission: RE | Admit: 2019-09-28 | Discharge: 2019-09-28 | Disposition: A | Discharge status: Home / Self Care | Payer: 59 | Source: Ambulatory Visit | Attending: Radiation Oncology | Admitting: Radiation Oncology

## 2019-09-28 DIAGNOSIS — Z510 Encounter for antineoplastic radiation therapy: Secondary | ICD-10-CM | POA: Diagnosis not present

## 2019-09-28 DIAGNOSIS — C50212 Malignant neoplasm of upper-inner quadrant of left female breast: Secondary | ICD-10-CM | POA: Insufficient documentation

## 2019-09-29 ENCOUNTER — Ambulatory Visit
Admission: RE | Admit: 2019-09-29 | Discharge: 2019-09-29 | Disposition: A | Discharge status: Home / Self Care | Payer: 59 | Source: Ambulatory Visit | Attending: Radiation Oncology | Admitting: Radiation Oncology

## 2019-09-29 DIAGNOSIS — Z510 Encounter for antineoplastic radiation therapy: Secondary | ICD-10-CM | POA: Diagnosis not present

## 2019-09-30 ENCOUNTER — Ambulatory Visit
Admission: RE | Admit: 2019-09-30 | Discharge: 2019-09-30 | Disposition: A | Discharge status: Home / Self Care | Payer: 59 | Source: Ambulatory Visit | Attending: Radiation Oncology | Admitting: Radiation Oncology

## 2019-09-30 DIAGNOSIS — Z510 Encounter for antineoplastic radiation therapy: Secondary | ICD-10-CM | POA: Diagnosis not present

## 2019-10-01 ENCOUNTER — Ambulatory Visit
Admission: RE | Admit: 2019-10-01 | Discharge: 2019-10-01 | Disposition: A | Discharge status: Home / Self Care | Payer: 59 | Source: Ambulatory Visit | Attending: Radiation Oncology | Admitting: Radiation Oncology

## 2019-10-01 DIAGNOSIS — Z510 Encounter for antineoplastic radiation therapy: Secondary | ICD-10-CM | POA: Diagnosis not present

## 2019-10-04 ENCOUNTER — Ambulatory Visit
Admission: RE | Admit: 2019-10-04 | Discharge: 2019-10-04 | Disposition: A | Discharge status: Home / Self Care | Payer: 59 | Source: Ambulatory Visit | Attending: Radiation Oncology | Admitting: Radiation Oncology

## 2019-10-04 DIAGNOSIS — Z510 Encounter for antineoplastic radiation therapy: Secondary | ICD-10-CM | POA: Diagnosis not present

## 2019-10-05 ENCOUNTER — Inpatient Hospital Stay: Payer: 59

## 2019-10-05 ENCOUNTER — Ambulatory Visit
Admission: RE | Admit: 2019-10-05 | Discharge: 2019-10-05 | Disposition: A | Discharge status: Home / Self Care | Payer: 59 | Source: Ambulatory Visit | Attending: Radiation Oncology | Admitting: Radiation Oncology

## 2019-10-05 ENCOUNTER — Encounter: Payer: Self-pay | Admitting: Oncology

## 2019-10-05 ENCOUNTER — Other Ambulatory Visit: Payer: Self-pay

## 2019-10-05 ENCOUNTER — Telehealth: Payer: Self-pay | Admitting: *Deleted

## 2019-10-05 ENCOUNTER — Inpatient Hospital Stay: Payer: 59 | Attending: Oncology | Admitting: Oncology

## 2019-10-05 DIAGNOSIS — Z510 Encounter for antineoplastic radiation therapy: Secondary | ICD-10-CM | POA: Diagnosis not present

## 2019-10-05 DIAGNOSIS — C50812 Malignant neoplasm of overlapping sites of left female breast: Secondary | ICD-10-CM

## 2019-10-05 DIAGNOSIS — Z171 Estrogen receptor negative status [ER-]: Secondary | ICD-10-CM | POA: Insufficient documentation

## 2019-10-05 DIAGNOSIS — Z79899 Other long term (current) drug therapy: Secondary | ICD-10-CM | POA: Diagnosis not present

## 2019-10-05 DIAGNOSIS — F17210 Nicotine dependence, cigarettes, uncomplicated: Secondary | ICD-10-CM | POA: Diagnosis not present

## 2019-10-05 DIAGNOSIS — Z5112 Encounter for antineoplastic immunotherapy: Secondary | ICD-10-CM | POA: Insufficient documentation

## 2019-10-05 LAB — COMPREHENSIVE METABOLIC PANEL
ALT: 12 U/L (ref 0–44)
AST: 18 U/L (ref 15–41)
Albumin: 4.1 g/dL (ref 3.5–5.0)
Alkaline Phosphatase: 73 U/L (ref 38–126)
Anion gap: 12 (ref 5–15)
BUN: 13 mg/dL (ref 6–20)
CO2: 25 mmol/L (ref 22–32)
Calcium: 9.2 mg/dL (ref 8.9–10.3)
Chloride: 102 mmol/L (ref 98–111)
Creatinine, Ser: 0.53 mg/dL (ref 0.44–1.00)
GFR calc Af Amer: >60 mL/min (ref >60)
GFR calc non Af Amer: >60 mL/min (ref >60)
Glucose, Bld: 87 mg/dL (ref 70–99)
Potassium: 3.7 mmol/L (ref 3.5–5.1)
Sodium: 139 mmol/L (ref 135–145)
Total Bilirubin: 0.5 mg/dL (ref 0.3–1.2)
Total Protein: 7.5 g/dL (ref 6.5–8.1)

## 2019-10-05 LAB — CBC WITH DIFFERENTIAL/PLATELET
Abs Immature Granulocytes: 0.03 10*3/uL (ref 0.00–0.07)
Basophils Absolute: 0.1 10*3/uL (ref 0.0–0.1)
Basophils Relative: 1 %
Eosinophils Absolute: 0.1 10*3/uL (ref 0.0–0.5)
Eosinophils Relative: 1 %
HCT: 41.2 % (ref 36.0–46.0)
Hemoglobin: 14.1 g/dL (ref 12.0–15.0)
Immature Granulocytes: 0 %
Lymphocytes Relative: 38 %
Lymphs Abs: 3.4 10*3/uL (ref 0.7–4.0)
MCH: 34.9 pg — ABNORMAL HIGH (ref 26.0–34.0)
MCHC: 34.2 g/dL (ref 30.0–36.0)
MCV: 102 fL — ABNORMAL HIGH (ref 80.0–100.0)
Monocytes Absolute: 0.6 10*3/uL (ref 0.1–1.0)
Monocytes Relative: 7 %
Neutro Abs: 4.8 10*3/uL (ref 1.7–7.7)
Neutrophils Relative %: 53 %
Platelets: 289 10*3/uL (ref 150–400)
RBC: 4.04 MIL/uL (ref 3.87–5.11)
RDW: 12.6 % (ref 11.5–15.5)
WBC: 9 10*3/uL (ref 4.0–10.5)
nRBC: 0 % (ref 0.0–0.2)

## 2019-10-05 MED ORDER — SODIUM CHLORIDE 0.9 % IV SOLN
Freq: Once | INTRAVENOUS | Status: AC
  Filled 2019-10-05: qty 250

## 2019-10-05 MED ORDER — HEPARIN SOD (PORK) LOCK FLUSH 100 UNIT/ML IV SOLN
500.0000 [IU] | Freq: Once | INTRAVENOUS | Status: AC
  Administered 2019-10-05: 500 [IU] via INTRAVENOUS
  Filled 2019-10-05: qty 5

## 2019-10-05 MED ORDER — SODIUM CHLORIDE 0.9 % IV SOLN
10.0000 mg | Freq: Once | INTRAVENOUS | Status: AC
  Administered 2019-10-05: 10 mg via INTRAVENOUS
  Filled 2019-10-05: qty 10

## 2019-10-05 NOTE — Telephone Encounter (Signed)
Patient will see [facility] this afternoon, She was advised to stop the Gabapentin and repeated this back to me

## 2019-10-05 NOTE — Patient Instructions (Signed)
I am so sorry you are not feeling well today [name].  While you were here we checked lab work which actually looks pretty good.  CBC    Component Value Date/Time   WBC 9.0 10/05/2019 1420   RBC 4.04 10/05/2019 1420   HGB 14.1 10/05/2019 1420   HGB 14.2 02/25/2014 1107   HCT 41.2 10/05/2019 1420   HCT 43.6 02/25/2014 1107   PLT 289 10/05/2019 1420   PLT 296 02/25/2014 1107   MCV 102.0 (H) 10/05/2019 1420   MCV 97 02/25/2014 1107   MCH 34.9 (H) 10/05/2019 1420   MCHC 34.2 10/05/2019 1420   RDW 12.6 10/05/2019 1420   RDW 14.1 02/25/2014 1107   LYMPHSABS 3.4 10/05/2019 1420   LYMPHSABS 1.2 02/25/2014 1107   MONOABS 0.6 10/05/2019 1420   MONOABS 0.3 02/25/2014 1107   EOSABS 0.1 10/05/2019 1420   EOSABS 0.0 02/25/2014 1107   BASOSABS 0.1 10/05/2019 1420   BASOSABS 0.1 02/25/2014 1107     Chemistry      Component Value Date/Time   NA 139 10/05/2019 1420   NA 140 02/25/2014 1107   K 3.7 10/05/2019 1420   K 4.2 02/25/2014 1107   CL 102 10/05/2019 1420   CL 105 02/25/2014 1107   CO2 25 10/05/2019 1420   CO2 28 02/25/2014 1107   BUN 13 10/05/2019 1420   BUN 8 02/25/2014 1107   CREATININE 0.53 10/05/2019 1420   CREATININE 0.81 02/25/2014 1107      Component Value Date/Time   CALCIUM 9.2 10/05/2019 1420   CALCIUM 8.9 02/25/2014 1107   ALKPHOS 73 10/05/2019 1420   ALKPHOS 59 02/25/2014 1107   AST 18 10/05/2019 1420   AST 19 02/25/2014 1107   ALT 12 10/05/2019 1420   ALT 18 02/25/2014 1107   BILITOT 0.5 10/05/2019 1420   BILITOT 0.4 02/25/2014 1107     While you were here you were given a liter of normal saline and 10 mg of dexamethasone.  The steroid should help some with appetite and energy.  Continue to drink plenty of fluids.  Your vital signs look pretty good today.  I have supplied you with some electrolyte replacement packets to help stay hydrated.  Feel free to contact clinic if you have any concerns or questions.  Faythe Casa, NP 10/05/2019 2:46  PM

## 2019-10-05 NOTE — Telephone Encounter (Signed)
Patient called and reports that since starting the Gabapentin, she is having confusion, weakness in her legs and dizziness. She is asking what to do about it. She does have an appointment with radiation therapy at 1 today. Please advise

## 2019-10-05 NOTE — Progress Notes (Signed)
Pt feeling foggy in her head, dizziness this am but not now. Feels like legs are heavy and hard to move-feeling weak. Does not eat much in hot weather, has appt for nutrition 6/20. Mostly drinks gingerale and tea that is caffeinated
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
## 2019-10-05 NOTE — Telephone Encounter (Signed)
She needs to stop the gabapentin. I can see her after radiation.   Faythe Casa, NP 10/05/2019 10:01 AM

## 2019-10-06 ENCOUNTER — Ambulatory Visit
Admission: RE | Admit: 2019-10-06 | Discharge: 2019-10-06 | Disposition: A | Discharge status: Home / Self Care | Payer: 59 | Source: Ambulatory Visit | Attending: Radiation Oncology | Admitting: Radiation Oncology

## 2019-10-06 DIAGNOSIS — Z510 Encounter for antineoplastic radiation therapy: Secondary | ICD-10-CM | POA: Diagnosis not present

## 2019-10-07 ENCOUNTER — Ambulatory Visit
Admission: RE | Admit: 2019-10-07 | Discharge: 2019-10-07 | Disposition: A | Discharge status: Home / Self Care | Payer: 59 | Source: Ambulatory Visit | Attending: Radiation Oncology | Admitting: Radiation Oncology

## 2019-10-07 DIAGNOSIS — Z510 Encounter for antineoplastic radiation therapy: Secondary | ICD-10-CM | POA: Diagnosis not present

## 2019-10-08 ENCOUNTER — Ambulatory Visit: Admission: RE | Admit: 2019-10-08 | Payer: 59 | Source: Ambulatory Visit

## 2019-10-08 ENCOUNTER — Ambulatory Visit
Admission: RE | Admit: 2019-10-08 | Discharge: 2019-10-08 | Disposition: A | Discharge status: Home / Self Care | Payer: 59 | Source: Ambulatory Visit | Attending: Radiation Oncology | Admitting: Radiation Oncology

## 2019-10-08 DIAGNOSIS — Z510 Encounter for antineoplastic radiation therapy: Secondary | ICD-10-CM | POA: Diagnosis not present

## 2019-10-11 ENCOUNTER — Ambulatory Visit
Admission: RE | Admit: 2019-10-11 | Discharge: 2019-10-11 | Disposition: A | Discharge status: Home / Self Care | Payer: 59 | Source: Ambulatory Visit | Attending: Radiation Oncology | Admitting: Radiation Oncology

## 2019-10-11 DIAGNOSIS — Z510 Encounter for antineoplastic radiation therapy: Secondary | ICD-10-CM | POA: Diagnosis not present

## 2019-10-12 ENCOUNTER — Ambulatory Visit
Admission: RE | Admit: 2019-10-12 | Discharge: 2019-10-12 | Disposition: A | Discharge status: Home / Self Care | Payer: 59 | Source: Ambulatory Visit | Attending: Radiation Oncology | Admitting: Radiation Oncology

## 2019-10-12 DIAGNOSIS — Z510 Encounter for antineoplastic radiation therapy: Secondary | ICD-10-CM | POA: Diagnosis not present

## 2019-10-13 ENCOUNTER — Other Ambulatory Visit: Payer: Self-pay

## 2019-10-13 ENCOUNTER — Inpatient Hospital Stay: Payer: 59

## 2019-10-13 ENCOUNTER — Ambulatory Visit
Admission: RE | Admit: 2019-10-13 | Discharge: 2019-10-13 | Disposition: A | Discharge status: Home / Self Care | Payer: 59 | Source: Ambulatory Visit | Attending: Radiation Oncology | Admitting: Radiation Oncology

## 2019-10-13 ENCOUNTER — Encounter: Payer: Self-pay | Admitting: *Deleted

## 2019-10-13 ENCOUNTER — Inpatient Hospital Stay (HOSPITAL_BASED_OUTPATIENT_CLINIC_OR_DEPARTMENT_OTHER): Payer: 59 | Admitting: Internal Medicine

## 2019-10-13 DIAGNOSIS — Z171 Estrogen receptor negative status [ER-]: Secondary | ICD-10-CM

## 2019-10-13 DIAGNOSIS — Z510 Encounter for antineoplastic radiation therapy: Secondary | ICD-10-CM | POA: Diagnosis not present

## 2019-10-13 DIAGNOSIS — C50812 Malignant neoplasm of overlapping sites of left female breast: Secondary | ICD-10-CM | POA: Diagnosis not present

## 2019-10-13 DIAGNOSIS — Z5112 Encounter for antineoplastic immunotherapy: Secondary | ICD-10-CM | POA: Diagnosis not present

## 2019-10-13 LAB — CBC WITH DIFFERENTIAL/PLATELET
Abs Immature Granulocytes: 0.02 10*3/uL (ref 0.00–0.07)
Basophils Absolute: 0 10*3/uL (ref 0.0–0.1)
Basophils Relative: 0 %
Eosinophils Absolute: 0.1 10*3/uL (ref 0.0–0.5)
Eosinophils Relative: 1 %
HCT: 40.5 % (ref 36.0–46.0)
Hemoglobin: 14.2 g/dL (ref 12.0–15.0)
Immature Granulocytes: 0 %
Lymphocytes Relative: 25 %
Lymphs Abs: 2.2 10*3/uL (ref 0.7–4.0)
MCH: 35.6 pg — ABNORMAL HIGH (ref 26.0–34.0)
MCHC: 35.1 g/dL (ref 30.0–36.0)
MCV: 101.5 fL — ABNORMAL HIGH (ref 80.0–100.0)
Monocytes Absolute: 0.8 10*3/uL (ref 0.1–1.0)
Monocytes Relative: 9 %
Neutro Abs: 5.7 10*3/uL (ref 1.7–7.7)
Neutrophils Relative %: 65 %
Platelets: 271 10*3/uL (ref 150–400)
RBC: 3.99 MIL/uL (ref 3.87–5.11)
RDW: 12.6 % (ref 11.5–15.5)
WBC: 8.9 10*3/uL (ref 4.0–10.5)
nRBC: 0 % (ref 0.0–0.2)

## 2019-10-13 LAB — COMPREHENSIVE METABOLIC PANEL
ALT: 11 U/L (ref 0–44)
AST: 19 U/L (ref 15–41)
Albumin: 4 g/dL (ref 3.5–5.0)
Alkaline Phosphatase: 64 U/L (ref 38–126)
Anion gap: 10 (ref 5–15)
BUN: 17 mg/dL (ref 6–20)
CO2: 27 mmol/L (ref 22–32)
Calcium: 9.2 mg/dL (ref 8.9–10.3)
Chloride: 100 mmol/L (ref 98–111)
Creatinine, Ser: 0.63 mg/dL (ref 0.44–1.00)
GFR calc Af Amer: >60 mL/min (ref >60)
GFR calc non Af Amer: >60 mL/min (ref >60)
Glucose, Bld: 99 mg/dL (ref 70–99)
Potassium: 3.9 mmol/L (ref 3.5–5.1)
Sodium: 137 mmol/L (ref 135–145)
Total Bilirubin: 0.5 mg/dL (ref 0.3–1.2)
Total Protein: 7.3 g/dL (ref 6.5–8.1)

## 2019-10-13 MED ORDER — ACETAMINOPHEN 325 MG PO TABS
650.0000 mg | ORAL_TABLET | Freq: Once | ORAL | Status: AC
  Administered 2019-10-13: 650 mg via ORAL
  Filled 2019-10-13: qty 2

## 2019-10-13 MED ORDER — SODIUM CHLORIDE 0.9% FLUSH
10.0000 mL | INTRAVENOUS | Status: DC | PRN
  Administered 2019-10-13: 10 mL
  Filled 2019-10-13: qty 10

## 2019-10-13 MED ORDER — HEPARIN SOD (PORK) LOCK FLUSH 100 UNIT/ML IV SOLN
INTRAVENOUS | Status: AC
  Filled 2019-10-13: qty 5

## 2019-10-13 MED ORDER — HEPARIN SOD (PORK) LOCK FLUSH 100 UNIT/ML IV SOLN
500.0000 [IU] | Freq: Once | INTRAVENOUS | Status: AC | PRN
  Administered 2019-10-13: 500 [IU]
  Filled 2019-10-13: qty 5

## 2019-10-13 MED ORDER — SODIUM CHLORIDE 0.9 % IV SOLN
Freq: Once | INTRAVENOUS | Status: AC
  Filled 2019-10-13: qty 250

## 2019-10-13 MED ORDER — TRASTUZUMAB-ANNS CHEMO 150 MG IV SOLR
300.0000 mg | Freq: Once | INTRAVENOUS | Status: AC
  Administered 2019-10-13: 300 mg via INTRAVENOUS
  Filled 2019-10-13: qty 14.29

## 2019-10-13 MED ORDER — SODIUM CHLORIDE 0.9 % IV SOLN
420.0000 mg | Freq: Once | INTRAVENOUS | Status: AC
  Administered 2019-10-13: 420 mg via INTRAVENOUS
  Filled 2019-10-13: qty 14

## 2019-10-13 MED ORDER — DIPHENHYDRAMINE HCL 25 MG PO CAPS
50.0000 mg | ORAL_CAPSULE | Freq: Once | ORAL | Status: AC
  Administered 2019-10-13: 50 mg via ORAL
  Filled 2019-10-13: qty 2

## 2019-10-13 NOTE — Assessment & Plan Note (Addendum)
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 On mainatence Herceptin-Perjeta.  Currently on radiation.  # Proceed with mainatence Herceptin-Perjeta. Labs today reviewed;  acceptable for treatment today. MUGA may 2021- 52% down from 57 % in Oct 2020.  We will repeat MUGA scan again in 3 months.  # PN-[clinical trial]-grade-2; stable stop gabapentin because of poor tolerance see below.  Monitor for now  #Mental status changes/confusion-secondary to gabapentin; currently off gabapentin.  Improved.  # DISPOSITION:  # treatment today # follow up in 3 weeks MD;  labs-cbc/cmp; Perjeta-Herceptin-Dr.B
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
## 2019-10-13 NOTE — Progress Notes (Signed)
Dr. Rogue Bussing has recommended acupuncture for chemotherapy induced neuropathy.  I have left the patient a message to return my call to assist with setting up a referral for her.

## 2019-10-14 ENCOUNTER — Telehealth: Payer: Self-pay | Admitting: General Practice

## 2019-10-14 ENCOUNTER — Encounter: Payer: Self-pay | Admitting: *Deleted

## 2019-10-14 ENCOUNTER — Ambulatory Visit
Admission: RE | Admit: 2019-10-14 | Discharge: 2019-10-14 | Disposition: A | Discharge status: Home / Self Care | Payer: 59 | Source: Ambulatory Visit | Attending: Radiation Oncology | Admitting: Radiation Oncology

## 2019-10-14 DIAGNOSIS — Z510 Encounter for antineoplastic radiation therapy: Secondary | ICD-10-CM | POA: Diagnosis not present

## 2019-10-14 NOTE — Telephone Encounter (Signed)
[place] CSW Progress Notes  Call from patient - she is tearful and overwhelmed.  She is a patient at [facility] - message will be sent to her oncologist and [place] for further assistance.  Provided supportive listening, reassurance and resources to patient on call today as she was in significant distress.  Reports that last two weeks have been very difficult for her emotionally.  She is almost done w radiation treatments, will continue w chemotherapy until Dec 2021.  Up til now, she has been focused on "staying strong" for others, this is now falling apart for her.  She is aware of multiple losses.  In additional cancer diagnosis/treatment, she fell and broke her wrist 3 weeks post diagnosis.  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  Her husband and family have been supportive, but she also feels she is "in this all by myself" as COVID restrictions have meant that she has had all treatments alone without the support of others.  The combination of COVID pandemic and cancer has been very difficult for her.  She is appreciative of the help of [place] who has provided referrals to various financial resources - these have been very useful as she has been without income generating ability since diagnosis.  She is requesting referral for supportive counseling - request sent to [place] and her oncologist.  Also sent her email w information on [place] programming and various resources appropriate for support of those diagnosed w breast cancer.  She has my contact information via email if needed.  Edwyna Shell, LCSW Clinical Social Worker Phone:  [phone] Cell:  [phone]

## 2019-10-14 NOTE — Progress Notes (Signed)
Received referral from Dr. Rogue Bussing that the patient needs referral for acupuncture.  I spoke to the patient and she would like to see Dr. Francisca December.  I have emailed him a referral.  Will fax approval form next week to his office.

## 2019-10-15 ENCOUNTER — Ambulatory Visit
Admission: RE | Admit: 2019-10-15 | Discharge: 2019-10-15 | Disposition: A | Discharge status: Home / Self Care | Payer: 59 | Source: Ambulatory Visit | Attending: Radiation Oncology | Admitting: Radiation Oncology

## 2019-10-15 DIAGNOSIS — Z510 Encounter for antineoplastic radiation therapy: Secondary | ICD-10-CM | POA: Diagnosis not present

## 2019-10-18 ENCOUNTER — Ambulatory Visit
Admission: RE | Admit: 2019-10-18 | Discharge: 2019-10-18 | Disposition: A | Discharge status: Home / Self Care | Payer: 59 | Source: Ambulatory Visit | Attending: Radiation Oncology | Admitting: Radiation Oncology

## 2019-10-18 ENCOUNTER — Inpatient Hospital Stay: Payer: 59

## 2019-10-18 DIAGNOSIS — Z510 Encounter for antineoplastic radiation therapy: Secondary | ICD-10-CM | POA: Diagnosis not present

## 2019-10-18 NOTE — Progress Notes (Signed)
Nutrition Assessment:  Referral weight loss.  49 year old female with left breast cancer.  Patient currently on herceptin and perjeta and radiation.  Past medical history of HLD, anxiety. Followed by Dr. Jacinto Reap.   Spoke with patient via phone for nutrition assessment.  Patient reports that her appetite is not so good.  Denies nausea. Reports that hot weather effects her appetite.  Reports that she may eat something like spaghetti at 10am or leftovers, not a big breakfast food eater.  She tends to snack during the day (chips, vegetables). Supper is meat and vegetables typically.  Drinks boost (240 calories, 20 g protein) 1 time per day. Likes the ensure rapid hydration electrolyte packets mixed with water.      Medications: MVI, compazine, Vit B 12, Vit D  Labs: reviewed  Anthropometrics:   Height: 64 inches Weight: 115 lb Noted 123 lb 4/8 128 lb 03/11/2019 BMI: 19  6% weight loss in the last 2 months, concerning.   Estimated Energy Needs  Kcals: 1500-1800 Protein: 75-90 g Fluid: > 1.5 L  NUTRITION DIAGNOSIS: Inadequate oral intake related to cancer related treatment side effects as evidenced by 6% weight loss in the last 2 months   INTERVENTION:  Discussed ways to add calories and protein.  Will provide High Calorie, High Protein Nutrition Therapy handout for patient to pick up at radiation today.  Encouraged small frequent meals Encouraged high calorie oral nutrition supplement 1-2 times per day.  Samples of ensure enlive, orgain, [employer] provided.   Contact information provided  MONITORING, EVALUATION, GOAL: weight trends, intake  NEXT VISIT: July 19th phone f/u  [place] Kendra B. Zenia Resides, [place], [place] Registered Dietitian [phone] (pager)

## 2019-10-19 ENCOUNTER — Ambulatory Visit
Admission: RE | Admit: 2019-10-19 | Discharge: 2019-10-19 | Disposition: A | Discharge status: Home / Self Care | Payer: 59 | Source: Ambulatory Visit | Attending: Radiation Oncology | Admitting: Radiation Oncology

## 2019-10-19 DIAGNOSIS — Z510 Encounter for antineoplastic radiation therapy: Secondary | ICD-10-CM | POA: Diagnosis not present

## 2019-10-20 ENCOUNTER — Ambulatory Visit: Payer: 59

## 2019-10-22 ENCOUNTER — Telehealth: Payer: Self-pay | Admitting: General Practice

## 2019-10-22 NOTE — Telephone Encounter (Signed)
[place] CSW Progress Notes  Call to patient to check in and determine her needs for help w anxiety management at this time.  No answer, left a VM w my contact information and encouragement to call.  Also messaged her oncologist to ask if they have made a referral for mental health support and/or determined that this is not a current need for this patient.   Edwyna Shell, LCSW Clinical Social Worker [place] Phone:  [phone] Cell:  [phone]

## 2019-10-25 ENCOUNTER — Ambulatory Visit: Payer: 59

## 2019-11-03 ENCOUNTER — Inpatient Hospital Stay: Payer: 59

## 2019-11-03 ENCOUNTER — Other Ambulatory Visit: Payer: Self-pay

## 2019-11-03 ENCOUNTER — Inpatient Hospital Stay (HOSPITAL_BASED_OUTPATIENT_CLINIC_OR_DEPARTMENT_OTHER): Payer: 59 | Admitting: Internal Medicine

## 2019-11-03 ENCOUNTER — Encounter: Payer: Self-pay | Admitting: Internal Medicine

## 2019-11-03 ENCOUNTER — Inpatient Hospital Stay: Payer: 59 | Attending: Internal Medicine

## 2019-11-03 DIAGNOSIS — C50812 Malignant neoplasm of overlapping sites of left female breast: Secondary | ICD-10-CM | POA: Insufficient documentation

## 2019-11-03 DIAGNOSIS — Z5112 Encounter for antineoplastic immunotherapy: Secondary | ICD-10-CM | POA: Insufficient documentation

## 2019-11-03 DIAGNOSIS — Z79899 Other long term (current) drug therapy: Secondary | ICD-10-CM | POA: Diagnosis not present

## 2019-11-03 DIAGNOSIS — Z171 Estrogen receptor negative status [ER-]: Secondary | ICD-10-CM | POA: Diagnosis not present

## 2019-11-03 LAB — COMPREHENSIVE METABOLIC PANEL
ALT: 13 U/L (ref 0–44)
AST: 20 U/L (ref 15–41)
Albumin: 3.8 g/dL (ref 3.5–5.0)
Alkaline Phosphatase: 69 U/L (ref 38–126)
Anion gap: 12 (ref 5–15)
BUN: 10 mg/dL (ref 6–20)
CO2: 25 mmol/L (ref 22–32)
Calcium: 9.1 mg/dL (ref 8.9–10.3)
Chloride: 103 mmol/L (ref 98–111)
Creatinine, Ser: 0.58 mg/dL (ref 0.44–1.00)
GFR calc Af Amer: >60 mL/min (ref >60)
GFR calc non Af Amer: >60 mL/min (ref >60)
Glucose, Bld: 88 mg/dL (ref 70–99)
Potassium: 3.9 mmol/L (ref 3.5–5.1)
Sodium: 140 mmol/L (ref 135–145)
Total Bilirubin: 0.4 mg/dL (ref 0.3–1.2)
Total Protein: 7.1 g/dL (ref 6.5–8.1)

## 2019-11-03 LAB — CBC WITH DIFFERENTIAL/PLATELET
Abs Immature Granulocytes: 0.03 10*3/uL (ref 0.00–0.07)
Basophils Absolute: 0.1 10*3/uL (ref 0.0–0.1)
Basophils Relative: 1 %
Eosinophils Absolute: 0.1 10*3/uL (ref 0.0–0.5)
Eosinophils Relative: 1 %
HCT: 37.9 % (ref 36.0–46.0)
Hemoglobin: 13.1 g/dL (ref 12.0–15.0)
Immature Granulocytes: 0 %
Lymphocytes Relative: 22 %
Lymphs Abs: 2.2 10*3/uL (ref 0.7–4.0)
MCH: 35.2 pg — ABNORMAL HIGH (ref 26.0–34.0)
MCHC: 34.6 g/dL (ref 30.0–36.0)
MCV: 101.9 fL — ABNORMAL HIGH (ref 80.0–100.0)
Monocytes Absolute: 0.7 10*3/uL (ref 0.1–1.0)
Monocytes Relative: 7 %
Neutro Abs: 7.2 10*3/uL (ref 1.7–7.7)
Neutrophils Relative %: 69 %
Platelets: 290 10*3/uL (ref 150–400)
RBC: 3.72 MIL/uL — ABNORMAL LOW (ref 3.87–5.11)
RDW: 12.7 % (ref 11.5–15.5)
WBC: 10.2 10*3/uL (ref 4.0–10.5)
nRBC: 0 % (ref 0.0–0.2)

## 2019-11-03 MED ORDER — MIRTAZAPINE 15 MG PO TABS
15.0000 mg | ORAL_TABLET | Freq: Every day | ORAL | 4 refills | Status: DC
Start: 2019-11-03 — End: 2019-11-24

## 2019-11-03 MED ORDER — HEPARIN SOD (PORK) LOCK FLUSH 100 UNIT/ML IV SOLN
INTRAVENOUS | Status: AC
  Filled 2019-11-03: qty 5

## 2019-11-03 MED ORDER — SODIUM CHLORIDE 0.9% FLUSH
10.0000 mL | Freq: Once | INTRAVENOUS | Status: AC
  Administered 2019-11-03: 10 mL via INTRAVENOUS
  Filled 2019-11-03: qty 10

## 2019-11-03 MED ORDER — TRASTUZUMAB-ANNS CHEMO 150 MG IV SOLR
300.0000 mg | Freq: Once | INTRAVENOUS | Status: AC
  Administered 2019-11-03: 300 mg via INTRAVENOUS
  Filled 2019-11-03: qty 14.29

## 2019-11-03 MED ORDER — SODIUM CHLORIDE 0.9 % IV SOLN
Freq: Once | INTRAVENOUS | Status: AC
  Filled 2019-11-03: qty 250

## 2019-11-03 MED ORDER — ACETAMINOPHEN 325 MG PO TABS
650.0000 mg | ORAL_TABLET | Freq: Once | ORAL | Status: AC
  Administered 2019-11-03: 650 mg via ORAL
  Filled 2019-11-03: qty 2

## 2019-11-03 MED ORDER — DIPHENHYDRAMINE HCL 25 MG PO CAPS
50.0000 mg | ORAL_CAPSULE | Freq: Once | ORAL | Status: AC
  Administered 2019-11-03: 50 mg via ORAL
  Filled 2019-11-03: qty 2

## 2019-11-03 MED ORDER — HEPARIN SOD (PORK) LOCK FLUSH 100 UNIT/ML IV SOLN
500.0000 [IU] | Freq: Once | INTRAVENOUS | Status: AC | PRN
  Administered 2019-11-03: 500 [IU]
  Filled 2019-11-03: qty 5

## 2019-11-03 MED ORDER — SODIUM CHLORIDE 0.9 % IV SOLN
420.0000 mg | Freq: Once | INTRAVENOUS | Status: AC
  Administered 2019-11-03: 420 mg via INTRAVENOUS
  Filled 2019-11-03: qty 14

## 2019-11-03 MED ORDER — HEPARIN SOD (PORK) LOCK FLUSH 100 UNIT/ML IV SOLN
500.0000 [IU] | Freq: Once | INTRAVENOUS | Status: AC
  Filled 2019-11-03: qty 5
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
## 2019-11-03 NOTE — Assessment & Plan Note (Addendum)
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 On mainatence Herceptin-Perjeta.  S/p radiation On mainatence Herceptin-Perjeta.   # Proceed with herceptin-Perjeta. Labs today reviewed;  acceptable for treatment today. MUGA may 2021- 52% down from 57 % in Oct 2020.    # PN-[clinical trial]-grade-2; STABLE; [poor tol to gabapentin]; s/p acci puncture x 2.  Hold off any pharmacologic therapy at this time.  # Insomnia- on Melatonin; not well controlled.  See below  # Depression/Anxiety- on Zoloft; stop Zoloft; START Remeron 15 mg qhs.  Recommend counseling discussed with Sheena/Anne.  # DISPOSITION:  # treatment today # follow up in 3 weeks MD;  labs-cbc/cmp; Perjeta-Herceptin-Dr.B

## 2019-11-10 ENCOUNTER — Other Ambulatory Visit: Payer: Self-pay | Admitting: *Deleted

## 2019-11-10 DIAGNOSIS — F32A Depression, unspecified: Secondary | ICD-10-CM

## 2019-11-10 DIAGNOSIS — F411 Generalized anxiety disorder: Secondary | ICD-10-CM

## 2019-11-10 DIAGNOSIS — F4323 Adjustment disorder with mixed anxiety and depressed mood: Secondary | ICD-10-CM

## 2019-11-15 ENCOUNTER — Inpatient Hospital Stay: Payer: 59

## 2019-11-15 NOTE — Progress Notes (Signed)
Nutrition Follow-up:  Patient with left breast cancer.  Currently on herceptin and perjeta.  Patient followed by Dr. Jacinto Reap.    Spoke with patient via phone for nutrition follow-up.  Patient reports that her appetite is better, although hot days (summer) appetite tends to decline.  Patient reports that she has been eating bolonga sandwiches recently and knows they are not the healthiest option but they have been tasting so good.  Reports that she is drinking boost shakes but prefers ensure and will get that next time. Has been on vacation recently and enjoyed fresh seafood.  Denies any nutrition impact symptoms at this time    Medications: remeron  Labs: reviewed  Anthropometrics:   Weight 119 lb on 7/7 increased from 115 lb  123 lb in August 05, 2019   NUTRITION DIAGNOSIS: Inadequate oral intake improved   INTERVENTION:  Reviewed strategies to continue adequate calories and protein and prevent weight loss. Discussed high calorie options of ensure products for supplementation.  Patient prefers to call RD if appetite changes or weight decreases.  Contact number provided    NEXT VISIT: no follow-up, patient to contact RD  [name], [place], [place] Registered Dietitian [phone] (pager)

## 2019-11-23 ENCOUNTER — Encounter: Payer: Self-pay | Admitting: Internal Medicine

## 2019-11-24 ENCOUNTER — Inpatient Hospital Stay: Payer: 59

## 2019-11-24 ENCOUNTER — Inpatient Hospital Stay (HOSPITAL_BASED_OUTPATIENT_CLINIC_OR_DEPARTMENT_OTHER): Payer: 59 | Admitting: Internal Medicine

## 2019-11-24 ENCOUNTER — Other Ambulatory Visit: Payer: Self-pay

## 2019-11-24 DIAGNOSIS — C50812 Malignant neoplasm of overlapping sites of left female breast: Secondary | ICD-10-CM

## 2019-11-24 DIAGNOSIS — Z171 Estrogen receptor negative status [ER-]: Secondary | ICD-10-CM | POA: Diagnosis not present

## 2019-11-24 DIAGNOSIS — Z5112 Encounter for antineoplastic immunotherapy: Secondary | ICD-10-CM | POA: Diagnosis not present

## 2019-11-24 LAB — CBC WITH DIFFERENTIAL/PLATELET
Abs Immature Granulocytes: 0.05 10*3/uL (ref 0.00–0.07)
Basophils Absolute: 0.1 10*3/uL (ref 0.0–0.1)
Basophils Relative: 1 %
Eosinophils Absolute: 0.1 10*3/uL (ref 0.0–0.5)
Eosinophils Relative: 1 %
HCT: 40 % (ref 36.0–46.0)
Hemoglobin: 13.6 g/dL (ref 12.0–15.0)
Immature Granulocytes: 0 %
Lymphocytes Relative: 28 %
Lymphs Abs: 3.3 10*3/uL (ref 0.7–4.0)
MCH: 34.9 pg — ABNORMAL HIGH (ref 26.0–34.0)
MCHC: 34 g/dL (ref 30.0–36.0)
MCV: 102.6 fL — ABNORMAL HIGH (ref 80.0–100.0)
Monocytes Absolute: 0.8 10*3/uL (ref 0.1–1.0)
Monocytes Relative: 6 %
Neutro Abs: 7.6 10*3/uL (ref 1.7–7.7)
Neutrophils Relative %: 64 %
Platelets: 305 10*3/uL (ref 150–400)
RBC: 3.9 MIL/uL (ref 3.87–5.11)
RDW: 13.2 % (ref 11.5–15.5)
WBC: 11.9 10*3/uL — ABNORMAL HIGH (ref 4.0–10.5)
nRBC: 0 % (ref 0.0–0.2)

## 2019-11-24 LAB — COMPREHENSIVE METABOLIC PANEL
ALT: 11 U/L (ref 0–44)
AST: 21 U/L (ref 15–41)
Albumin: 4 g/dL (ref 3.5–5.0)
Alkaline Phosphatase: 70 U/L (ref 38–126)
Anion gap: 10 (ref 5–15)
BUN: 15 mg/dL (ref 6–20)
CO2: 23 mmol/L (ref 22–32)
Calcium: 8.7 mg/dL — ABNORMAL LOW (ref 8.9–10.3)
Chloride: 104 mmol/L (ref 98–111)
Creatinine, Ser: 0.66 mg/dL (ref 0.44–1.00)
GFR calc Af Amer: >60 mL/min (ref >60)
GFR calc non Af Amer: >60 mL/min (ref >60)
Glucose, Bld: 87 mg/dL (ref 70–99)
Potassium: 3.8 mmol/L (ref 3.5–5.1)
Sodium: 137 mmol/L (ref 135–145)
Total Bilirubin: 0.6 mg/dL (ref 0.3–1.2)
Total Protein: 7.1 g/dL (ref 6.5–8.1)

## 2019-11-24 MED ORDER — HEPARIN SOD (PORK) LOCK FLUSH 100 UNIT/ML IV SOLN
500.0000 [IU] | Freq: Once | INTRAVENOUS | Status: AC | PRN
  Administered 2019-11-24: 500 [IU]
  Filled 2019-11-24: qty 5

## 2019-11-24 MED ORDER — SODIUM CHLORIDE 0.9% FLUSH
10.0000 mL | Freq: Once | INTRAVENOUS | Status: AC
  Administered 2019-11-24: 10 mL via INTRAVENOUS
  Filled 2019-11-24: qty 10

## 2019-11-24 MED ORDER — SODIUM CHLORIDE 0.9 % IV SOLN
Freq: Once | INTRAVENOUS | Status: AC
  Filled 2019-11-24: qty 250

## 2019-11-24 MED ORDER — MIRTAZAPINE 30 MG PO TABS
30.0000 mg | ORAL_TABLET | Freq: Every day | ORAL | 4 refills | Status: DC
Start: 2019-11-24 — End: 2020-02-23

## 2019-11-24 MED ORDER — HEPARIN SOD (PORK) LOCK FLUSH 100 UNIT/ML IV SOLN
INTRAVENOUS | Status: AC
  Filled 2019-11-24: qty 5

## 2019-11-24 MED ORDER — TRASTUZUMAB-ANNS CHEMO 150 MG IV SOLR
300.0000 mg | Freq: Once | INTRAVENOUS | Status: AC
  Administered 2019-11-24: 300 mg via INTRAVENOUS
  Filled 2019-11-24: qty 14.29

## 2019-11-24 MED ORDER — HEPARIN SOD (PORK) LOCK FLUSH 100 UNIT/ML IV SOLN
250.0000 [IU] | Freq: Once | INTRAVENOUS | Status: DC | PRN
  Filled 2019-11-24: qty 5

## 2019-11-24 MED ORDER — ACETAMINOPHEN 325 MG PO TABS
650.0000 mg | ORAL_TABLET | Freq: Once | ORAL | Status: AC
  Administered 2019-11-24: 650 mg via ORAL
  Filled 2019-11-24: qty 2

## 2019-11-24 MED ORDER — DIPHENHYDRAMINE HCL 25 MG PO CAPS
50.0000 mg | ORAL_CAPSULE | Freq: Once | ORAL | Status: AC
  Administered 2019-11-24: 50 mg via ORAL
  Filled 2019-11-24: qty 2

## 2019-11-24 MED ORDER — SODIUM CHLORIDE 0.9 % IV SOLN
420.0000 mg | Freq: Once | INTRAVENOUS | Status: AC
  Administered 2019-11-24: 420 mg via INTRAVENOUS
  Filled 2019-11-24: qty 14
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
## 2019-11-24 NOTE — Assessment & Plan Note (Addendum)
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 On mainatence Herceptin-Perjeta.  S/p radiation On mainatence Herceptin-Perjeta. STABLE.   # Proceed with herceptin-Perjeta. Labs today reviewed;  acceptable for treatment today. MUGA may 2021- 52% down from 57 % in Oct 2020.  Will order MUGA at next visit.   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 Over stable.  # Depression/Anxiety/insomnia-; increase Remeron to 30 mg qhs. Awaiting eval with psychiatry.   # DISPOSITION:  # treatment today # follow up in 3 weeks MD;  labs-cbc/cmp; Perjeta-Herceptin-Dr.B

## 2019-11-26 ENCOUNTER — Other Ambulatory Visit: Payer: Self-pay

## 2019-11-26 ENCOUNTER — Ambulatory Visit
Admission: RE | Admit: 2019-11-26 | Discharge: 2019-11-26 | Disposition: A | Discharge status: Home / Self Care | Payer: 59 | Source: Ambulatory Visit | Attending: Radiation Oncology | Admitting: Radiation Oncology

## 2019-11-26 VITALS — BP 126/81 | HR 102 | Temp 98.9°F | Wt 116.0 lb

## 2019-11-26 DIAGNOSIS — Z923 Personal history of irradiation: Secondary | ICD-10-CM | POA: Diagnosis not present

## 2019-11-26 DIAGNOSIS — C50812 Malignant neoplasm of overlapping sites of left female breast: Secondary | ICD-10-CM | POA: Diagnosis not present

## 2019-11-26 DIAGNOSIS — Z171 Estrogen receptor negative status [ER-]: Secondary | ICD-10-CM | POA: Insufficient documentation

## 2019-11-26 NOTE — Progress Notes (Signed)
Radiation Oncology Follow up Note  Name: Joy Joy   Date:   11/26/2019 MRN:  [id number] DOB: January 02, 1971    This 49 y.o. female presents to the clinic today for 1 month follow-up status post whole breast radiation to her left breast for stage I ER/PR negative HER-2/neu positive invasive mammary carcinoma.  REFERRING PROVIDER: Sharyne Peach, MD  HPI: Patient is a 49 year old female now at 1 month having completed whole breast radiation to her left breast for stage I ER/PR negative HER-2/neu positive invasive mammary carcinoma seen today in routine follow-up she is doing well.  She still has some breast tenderness although that is resolving.  She has some hyperpigmentation of the skin.  She is still currently under treatment with Herceptin..  COMPLICATIONS OF TREATMENT: none  FOLLOW UP COMPLIANCE: keeps appointments   PHYSICAL EXAM:  BP 126/81 (BP Location: Left Arm, Patient Position: Sitting, Cuff Size: Normal)   Pulse 102   Temp 98.9 F (37.2 C) (Tympanic)   Wt 116 lb (52.6 kg)   LMP 02/19/2016   BMI 19.91 kg/m  Left breast is somewhat hyperpigmented.  Otherwise no dominant mass or nodularity is noted in either breast in 2 positions examined.  No axillary or supraclavicular adenopathy is appreciated.  Well-developed well-nourished patient in NAD. HEENT reveals PERLA, EOMI, discs not visualized.  Oral cavity is clear. No oral mucosal lesions are identified. Neck is clear without evidence of cervical or supraclavicular adenopathy. Lungs are clear to A&P. Cardiac examination is essentially unremarkable with regular rate and rhythm without murmur rub or thrill. Abdomen is benign with no organomegaly or masses noted. Motor sensory and DTR levels are equal and symmetric in the upper and lower extremities. Cranial nerves II through XII are grossly intact. Proprioception is intact. No peripheral adenopathy or edema is identified. No motor or sensory levels are noted. Crude visual fields  are within normal range.  RADIOLOGY RESULTS: No current films to review  PLAN: Present time patient is doing well 1 month out from whole breast radiation.  I am pleased with her overall progress.  She continues on Herceptin.  She is not a candidate for antiestrogen medication based on her negative ER/PR status.  I asked to see her back in 4 to 5 months for follow-up.  Patient knows to call with any concerns.  I would like to take this opportunity to thank you for allowing me to participate in the care of your patient.Noreene Filbert, MD

## 2019-12-15 ENCOUNTER — Other Ambulatory Visit: Payer: Self-pay

## 2019-12-15 ENCOUNTER — Inpatient Hospital Stay (HOSPITAL_BASED_OUTPATIENT_CLINIC_OR_DEPARTMENT_OTHER): Payer: 59 | Admitting: Internal Medicine

## 2019-12-15 ENCOUNTER — Encounter: Payer: Self-pay | Admitting: Internal Medicine

## 2019-12-15 ENCOUNTER — Inpatient Hospital Stay: Payer: 59

## 2019-12-15 ENCOUNTER — Inpatient Hospital Stay: Payer: 59 | Attending: Internal Medicine

## 2019-12-15 VITALS — BP 113/86 | HR 90 | Temp 97.4°F | Resp 16 | Ht 64.0 in | Wt 119.0 lb

## 2019-12-15 DIAGNOSIS — C50812 Malignant neoplasm of overlapping sites of left female breast: Secondary | ICD-10-CM

## 2019-12-15 DIAGNOSIS — Z171 Estrogen receptor negative status [ER-]: Secondary | ICD-10-CM

## 2019-12-15 DIAGNOSIS — Z5181 Encounter for therapeutic drug level monitoring: Secondary | ICD-10-CM | POA: Diagnosis not present

## 2019-12-15 DIAGNOSIS — Z5112 Encounter for antineoplastic immunotherapy: Secondary | ICD-10-CM | POA: Diagnosis not present

## 2019-12-15 DIAGNOSIS — Z79899 Other long term (current) drug therapy: Secondary | ICD-10-CM

## 2019-12-15 LAB — COMPREHENSIVE METABOLIC PANEL
ALT: 12 U/L (ref 0–44)
AST: 22 U/L (ref 15–41)
Albumin: 4.1 g/dL (ref 3.5–5.0)
Alkaline Phosphatase: 76 U/L (ref 38–126)
Anion gap: 9 (ref 5–15)
BUN: 13 mg/dL (ref 6–20)
CO2: 26 mmol/L (ref 22–32)
Calcium: 9.3 mg/dL (ref 8.9–10.3)
Chloride: 105 mmol/L (ref 98–111)
Creatinine, Ser: 0.74 mg/dL (ref 0.44–1.00)
GFR calc Af Amer: >60 mL/min (ref >60)
GFR calc non Af Amer: >60 mL/min (ref >60)
Glucose, Bld: 127 mg/dL — ABNORMAL HIGH (ref 70–99)
Potassium: 3.5 mmol/L (ref 3.5–5.1)
Sodium: 140 mmol/L (ref 135–145)
Total Bilirubin: 0.7 mg/dL (ref 0.3–1.2)
Total Protein: 7.4 g/dL (ref 6.5–8.1)

## 2019-12-15 LAB — CBC WITH DIFFERENTIAL/PLATELET
Abs Immature Granulocytes: 0.02 10*3/uL (ref 0.00–0.07)
Basophils Absolute: 0.1 10*3/uL (ref 0.0–0.1)
Basophils Relative: 1 %
Eosinophils Absolute: 0.1 10*3/uL (ref 0.0–0.5)
Eosinophils Relative: 1 %
HCT: 39.6 % (ref 36.0–46.0)
Hemoglobin: 13.7 g/dL (ref 12.0–15.0)
Immature Granulocytes: 0 %
Lymphocytes Relative: 28 %
Lymphs Abs: 2.7 10*3/uL (ref 0.7–4.0)
MCH: 35.3 pg — ABNORMAL HIGH (ref 26.0–34.0)
MCHC: 34.6 g/dL (ref 30.0–36.0)
MCV: 102.1 fL — ABNORMAL HIGH (ref 80.0–100.0)
Monocytes Absolute: 0.7 10*3/uL (ref 0.1–1.0)
Monocytes Relative: 8 %
Neutro Abs: 5.9 10*3/uL (ref 1.7–7.7)
Neutrophils Relative %: 62 %
Platelets: 349 10*3/uL (ref 150–400)
RBC: 3.88 MIL/uL (ref 3.87–5.11)
RDW: 13.4 % (ref 11.5–15.5)
WBC: 9.5 10*3/uL (ref 4.0–10.5)
nRBC: 0 % (ref 0.0–0.2)

## 2019-12-15 MED ORDER — SODIUM CHLORIDE 0.9 % IV SOLN
420.0000 mg | Freq: Once | INTRAVENOUS | Status: AC
  Administered 2019-12-15: 420 mg via INTRAVENOUS
  Filled 2019-12-15: qty 14

## 2019-12-15 MED ORDER — HEPARIN SOD (PORK) LOCK FLUSH 100 UNIT/ML IV SOLN
INTRAVENOUS | Status: AC
  Filled 2019-12-15: qty 5

## 2019-12-15 MED ORDER — HEPARIN SOD (PORK) LOCK FLUSH 100 UNIT/ML IV SOLN
500.0000 [IU] | Freq: Once | INTRAVENOUS | Status: AC | PRN
  Administered 2019-12-15: 500 [IU]
  Filled 2019-12-15: qty 5

## 2019-12-15 MED ORDER — DIPHENHYDRAMINE HCL 25 MG PO CAPS
50.0000 mg | ORAL_CAPSULE | Freq: Once | ORAL | Status: AC
  Administered 2019-12-15: 50 mg via ORAL
  Filled 2019-12-15: qty 2

## 2019-12-15 MED ORDER — ACETAMINOPHEN 325 MG PO TABS
650.0000 mg | ORAL_TABLET | Freq: Once | ORAL | Status: AC
  Administered 2019-12-15: 650 mg via ORAL
  Filled 2019-12-15: qty 2

## 2019-12-15 MED ORDER — TRASTUZUMAB-ANNS CHEMO 150 MG IV SOLR
300.0000 mg | Freq: Once | INTRAVENOUS | Status: AC
  Administered 2019-12-15: 300 mg via INTRAVENOUS
  Filled 2019-12-15: qty 14.29

## 2019-12-15 MED ORDER — SODIUM CHLORIDE 0.9 % IV SOLN
Freq: Once | INTRAVENOUS | Status: AC
  Filled 2019-12-15: qty 250
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
## 2019-12-15 NOTE — Assessment & Plan Note (Addendum)
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 On mainatence Herceptin-Perjeta.  S/p radiation On mainatence Herceptin-Perjeta. STABLE.   # Proceed with herceptin-Perjeta. Labs today reviewed;  acceptable for treatment today. MUGA may 2021- 52% down from 57 % in Oct 2020.  Will order MUGA today.   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   # Depression/Anxiety/insomnia/chemo brain-  on  Remeron to 30 mg qhs. Awaiting eval with psychiatryalamance on sep 23rd.    # Covid Booster vaccination:pt's mother had concerns re: her grandaughter had encephalopathy from [place]. Again I reviewed my concerns of contracting Covid given patient's immunosuppression/ongoing therapies. Given the recurrence I think is reasonable to wait a few more weeks before proceeding with booster.  # DISPOSITION:  # treatment today # follow up in 3 weeks MD;  labs-cbc/cmp; Perjeta-Herceptin;MIUGA scan prior--Dr.B

## 2019-12-30 ENCOUNTER — Other Ambulatory Visit: Payer: Self-pay

## 2019-12-30 ENCOUNTER — Encounter
Admission: RE | Admit: 2019-12-30 | Discharge: 2019-12-30 | Disposition: A | Discharge status: Home / Self Care | Payer: 59 | Source: Ambulatory Visit | Attending: Internal Medicine | Admitting: Internal Medicine

## 2019-12-30 DIAGNOSIS — Z79899 Other long term (current) drug therapy: Secondary | ICD-10-CM | POA: Diagnosis present

## 2019-12-30 DIAGNOSIS — C50812 Malignant neoplasm of overlapping sites of left female breast: Secondary | ICD-10-CM | POA: Insufficient documentation

## 2019-12-30 DIAGNOSIS — Z171 Estrogen receptor negative status [ER-]: Secondary | ICD-10-CM | POA: Insufficient documentation

## 2019-12-30 DIAGNOSIS — Z5181 Encounter for therapeutic drug level monitoring: Secondary | ICD-10-CM | POA: Diagnosis present

## 2019-12-30 MED ORDER — TECHNETIUM TC 99M-LABELED RED BLOOD CELLS IV KIT
22.2900 | PACK | Freq: Once | INTRAVENOUS | Status: AC | PRN
  Administered 2019-12-30: 22.29 via INTRAVENOUS

## 2019-12-31 ENCOUNTER — Telehealth: Payer: Self-pay | Admitting: *Deleted

## 2019-12-31 NOTE — Telephone Encounter (Signed)
Patient had a possible Covid exposure on 03/16/69. She has received all  Vaccines and a booster  her family is fully vaccinated. She is asymptomatic and has had no contact with infected person. She adheres to [facility] protocol. Writer advised her to monitor for symptoms and get tested if symptoms should occur and to notify clinic if her condition changes. She has an appointment next week.

## 2020-01-05 ENCOUNTER — Inpatient Hospital Stay: Payer: 59

## 2020-01-05 ENCOUNTER — Other Ambulatory Visit: Payer: Self-pay

## 2020-01-05 ENCOUNTER — Inpatient Hospital Stay: Payer: 59 | Attending: Internal Medicine

## 2020-01-05 ENCOUNTER — Inpatient Hospital Stay (HOSPITAL_BASED_OUTPATIENT_CLINIC_OR_DEPARTMENT_OTHER): Payer: 59 | Admitting: Internal Medicine

## 2020-01-05 DIAGNOSIS — Z171 Estrogen receptor negative status [ER-]: Secondary | ICD-10-CM | POA: Diagnosis not present

## 2020-01-05 DIAGNOSIS — Z5112 Encounter for antineoplastic immunotherapy: Secondary | ICD-10-CM | POA: Diagnosis present

## 2020-01-05 DIAGNOSIS — Z79899 Other long term (current) drug therapy: Secondary | ICD-10-CM | POA: Insufficient documentation

## 2020-01-05 DIAGNOSIS — C50812 Malignant neoplasm of overlapping sites of left female breast: Secondary | ICD-10-CM

## 2020-01-05 LAB — COMPREHENSIVE METABOLIC PANEL
ALT: 12 U/L (ref 0–44)
AST: 21 U/L (ref 15–41)
Albumin: 3.9 g/dL (ref 3.5–5.0)
Alkaline Phosphatase: 78 U/L (ref 38–126)
Anion gap: 13 (ref 5–15)
BUN: 14 mg/dL (ref 6–20)
CO2: 23 mmol/L (ref 22–32)
Calcium: 9 mg/dL (ref 8.9–10.3)
Chloride: 102 mmol/L (ref 98–111)
Creatinine, Ser: 0.66 mg/dL (ref 0.44–1.00)
GFR calc Af Amer: >60 mL/min (ref >60)
GFR calc non Af Amer: >60 mL/min (ref >60)
Glucose, Bld: 104 mg/dL — ABNORMAL HIGH (ref 70–99)
Potassium: 3.5 mmol/L (ref 3.5–5.1)
Sodium: 138 mmol/L (ref 135–145)
Total Bilirubin: 0.5 mg/dL (ref 0.3–1.2)
Total Protein: 7.2 g/dL (ref 6.5–8.1)

## 2020-01-05 LAB — CBC WITH DIFFERENTIAL/PLATELET
Abs Immature Granulocytes: 0.02 10*3/uL (ref 0.00–0.07)
Basophils Absolute: 0.1 10*3/uL (ref 0.0–0.1)
Basophils Relative: 1 %
Eosinophils Absolute: 0.1 10*3/uL (ref 0.0–0.5)
Eosinophils Relative: 1 %
HCT: 40 % (ref 36.0–46.0)
Hemoglobin: 13.6 g/dL (ref 12.0–15.0)
Immature Granulocytes: 0 %
Lymphocytes Relative: 34 %
Lymphs Abs: 3.6 10*3/uL (ref 0.7–4.0)
MCH: 35.3 pg — ABNORMAL HIGH (ref 26.0–34.0)
MCHC: 34 g/dL (ref 30.0–36.0)
MCV: 103.9 fL — ABNORMAL HIGH (ref 80.0–100.0)
Monocytes Absolute: 0.8 10*3/uL (ref 0.1–1.0)
Monocytes Relative: 8 %
Neutro Abs: 5.9 10*3/uL (ref 1.7–7.7)
Neutrophils Relative %: 56 %
Platelets: 335 10*3/uL (ref 150–400)
RBC: 3.85 MIL/uL — ABNORMAL LOW (ref 3.87–5.11)
RDW: 12.7 % (ref 11.5–15.5)
WBC: 10.5 10*3/uL (ref 4.0–10.5)
nRBC: 0 % (ref 0.0–0.2)

## 2020-01-05 MED ORDER — SODIUM CHLORIDE 0.9 % IV SOLN
Freq: Once | INTRAVENOUS | Status: AC
  Filled 2020-01-05: qty 250

## 2020-01-05 MED ORDER — HEPARIN SOD (PORK) LOCK FLUSH 100 UNIT/ML IV SOLN
500.0000 [IU] | Freq: Once | INTRAVENOUS | Status: DC | PRN
  Filled 2020-01-05: qty 5

## 2020-01-05 MED ORDER — TRASTUZUMAB-ANNS CHEMO 150 MG IV SOLR
300.0000 mg | Freq: Once | INTRAVENOUS | Status: AC
  Administered 2020-01-05: 300 mg via INTRAVENOUS
  Filled 2020-01-05: qty 14.29

## 2020-01-05 MED ORDER — SODIUM CHLORIDE 0.9 % IV SOLN
420.0000 mg | Freq: Once | INTRAVENOUS | Status: AC
  Administered 2020-01-05: 420 mg via INTRAVENOUS
  Filled 2020-01-05: qty 14

## 2020-01-05 MED ORDER — HEPARIN SOD (PORK) LOCK FLUSH 100 UNIT/ML IV SOLN
500.0000 [IU] | Freq: Once | INTRAVENOUS | Status: AC
  Administered 2020-01-05: 500 [IU] via INTRAVENOUS
  Filled 2020-01-05: qty 5

## 2020-01-05 MED ORDER — ACETAMINOPHEN 325 MG PO TABS
650.0000 mg | ORAL_TABLET | Freq: Once | ORAL | Status: AC
  Administered 2020-01-05: 650 mg via ORAL
  Filled 2020-01-05: qty 2

## 2020-01-05 MED ORDER — SODIUM CHLORIDE 0.9% FLUSH
10.0000 mL | Freq: Once | INTRAVENOUS | Status: AC
  Administered 2020-01-05: 10 mL via INTRAVENOUS
  Filled 2020-01-05: qty 10

## 2020-01-05 MED ORDER — HEPARIN SOD (PORK) LOCK FLUSH 100 UNIT/ML IV SOLN
INTRAVENOUS | Status: AC
  Filled 2020-01-05: qty 5

## 2020-01-05 MED ORDER — DIPHENHYDRAMINE HCL 25 MG PO CAPS
50.0000 mg | ORAL_CAPSULE | Freq: Once | ORAL | Status: AC
  Administered 2020-01-05: 50 mg via ORAL
  Filled 2020-01-05: qty 2

## 2020-01-05 NOTE — Assessment & Plan Note (Addendum)
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 On mainatence Herceptin-Perjeta.  S/p radiation On mainatence Herceptin-Perjeta. STABLE.   # Proceed with herceptin-Perjeta. Labs today reviewed;  acceptable for treatment today. MUGA AUG 54%; may  2021- 52% ; Oct 2020.  Reviewed the MUGA scan.  # PN-[clinical trial]-grade-1; STABLE; [poor tol to gabapentin]; s/p accu puncture x 2.   # S/p fall- left knee pain-continue as needed NSAIDs.  # Depression/Anxiety/insomnia/chemo brain-  on  Remeron to 30 mg qhs. STABLE;  Awaiting eval with psychiatryalamance on sep 23rd.    # DISPOSITION:  # treatment today # follow up in 3 weeks MD;  labs-cbc/cmp; Perjeta-Herceptin;--Dr.B
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
## 2020-01-07 ENCOUNTER — Other Ambulatory Visit: Payer: Self-pay | Admitting: Surgery

## 2020-01-07 DIAGNOSIS — Z853 Personal history of malignant neoplasm of breast: Secondary | ICD-10-CM

## 2020-01-20 ENCOUNTER — Telehealth (INDEPENDENT_AMBULATORY_CARE_PROVIDER_SITE_OTHER): Payer: 59 | Admitting: Psychiatry

## 2020-01-20 ENCOUNTER — Encounter: Payer: Self-pay | Admitting: Psychiatry

## 2020-01-20 ENCOUNTER — Other Ambulatory Visit: Payer: Self-pay

## 2020-01-20 DIAGNOSIS — F411 Generalized anxiety disorder: Secondary | ICD-10-CM

## 2020-01-20 DIAGNOSIS — F321 Major depressive disorder, single episode, moderate: Secondary | ICD-10-CM | POA: Insufficient documentation

## 2020-01-20 DIAGNOSIS — Z634 Disappearance and death of family member: Secondary | ICD-10-CM | POA: Diagnosis not present

## 2020-01-20 MED ORDER — VENLAFAXINE HCL ER 37.5 MG PO CP24: 37.5000 mg | ORAL_CAPSULE | Freq: Every day | ORAL | 1 refills | Status: DC

## 2020-01-20 MED ORDER — HYDROXYZINE HCL 25 MG PO TABS: 25.0000 mg | ORAL_TABLET | Freq: Three times a day (TID) | ORAL | 1 refills | Status: DC | PRN

## 2020-01-20 NOTE — Patient Instructions (Signed)
Hydroxyzine capsules or tablets What is this medicine? HYDROXYZINE (hye [place] i zeen) is an antihistamine. This medicine is used to treat allergy symptoms. It is also used to treat anxiety and tension. This medicine can be used with other medicines to induce sleep before surgery. This medicine may be used for other purposes; ask your health care provider or pharmacist if you have questions. COMMON BRAND NAME(S): ANX, Atarax, Rezine, Vistaril What should I tell my health care provider before I take this medicine? They need to know if you have any of these conditions:  glaucoma  heart disease  history of irregular heartbeat  kidney disease  liver disease  lung or breathing disease, like asthma  stomach or intestine problems  thyroid disease  trouble passing urine  an unusual or allergic reaction to hydroxyzine, cetirizine, other medicines, foods, dyes or preservatives  pregnant or trying to get pregnant  breast-feeding How should I use this medicine? Take this medicine by mouth with a full glass of water. Follow the directions on the prescription label. You may take this medicine with food or on an empty stomach. Take your medicine at regular intervals. Do not take your medicine more often than directed. Talk to your pediatrician regarding the use of this medicine in children. Special care may be needed. While this drug may be prescribed for children as young as 73 years of age for selected conditions, precautions do apply. Patients over 36 years old may have a stronger reaction and need a smaller dose. Overdosage: If you think you have taken too much of this medicine contact a poison control center or emergency room at once. NOTE: This medicine is only for you. Do not share this medicine with others. What if I miss a dose? If you miss a dose, take it as soon as you can. If it is almost time for your next dose, take only that dose. Do not take double or extra doses. What may  interact with this medicine? Do not take this medicine with any of the following medications:  cisapride  dronedarone  pimozide  thioridazine This medicine may also interact with the following medications:  alcohol  antihistamines for allergy, cough, and cold  atropine  barbiturate medicines for sleep or seizures, like phenobarbital  certain antibiotics like erythromycin or clarithromycin  certain medicines for anxiety or sleep  certain medicines for bladder problems like oxybutynin, tolterodine  certain medicines for depression or psychotic disturbances  certain medicines for irregular heart beat  certain medicines for Parkinson's disease like benztropine, trihexyphenidyl  certain medicines for seizures like phenobarbital, primidone  certain medicines for stomach problems like dicyclomine, hyoscyamine  certain medicines for travel sickness like scopolamine  ipratropium  narcotic medicines for pain  other medicines that prolong the QT interval (an abnormal heart rhythm) like dofetilide This list may not describe all possible interactions. Give your health care provider a list of all the medicines, herbs, non-prescription drugs, or dietary supplements you use. Also tell them if you smoke, drink alcohol, or use illegal drugs. Some items may interact with your medicine. What should I watch for while using this medicine? Tell your doctor or health care professional if your symptoms do not improve. You may get drowsy or dizzy. Do not drive, use machinery, or do anything that needs mental alertness until you know how this medicine affects you. Do not stand or sit up quickly, especially if you are an older patient. This reduces the risk of dizzy or fainting spells. Alcohol may  interfere with the effect of this medicine. Avoid alcoholic drinks. Your mouth may get dry. Chewing sugarless gum or sucking hard candy, and drinking plenty of water may help. Contact your doctor if the  problem does not go away or is severe. This medicine may cause dry eyes and blurred vision. If you wear contact lenses you may feel some discomfort. Lubricating drops may help. See your eye doctor if the problem does not go away or is severe. If you are receiving skin tests for allergies, tell your doctor you are using this medicine. What side effects may I notice from receiving this medicine? Side effects that you should report to your doctor or health care professional as soon as possible:  allergic reactions like skin rash, itching or hives, swelling of the face, lips, or tongue  changes in vision  confusion  fast, irregular heartbeat  seizures  tremor  trouble passing urine or change in the amount of urine Side effects that usually do not require medical attention (report to your doctor or health care professional if they continue or are bothersome):  constipation  drowsiness  dry mouth  headache  tiredness This list may not describe all possible side effects. Call your doctor for medical advice about side effects. You may report side effects to FDA at 1-800-FDA-1088. Where should I keep my medicine? Keep out of the reach of children. Store at room temperature between 15 and 30 degrees C (59 and 86 degrees F). Keep container tightly closed. Throw away any unused medicine after the expiration date. NOTE: This sheet is a summary. It may not cover all possible information. If you have questions about this medicine, talk to your doctor, pharmacist, or health care provider.  2020 [employer] Standard (2018-04-06 13:19:55) Venlafaxine extended-release capsules What is this medicine? VENLAFAXINE(VEN la fax een) is used to treat depression, anxiety and panic disorder. This medicine may be used for other purposes; ask your health care provider or pharmacist if you have questions. COMMON BRAND NAME(S): Effexor XR What should I tell my health care provider before I take this  medicine? They need to know if you have any of these conditions:  bleeding disorders  glaucoma  heart disease  high blood pressure  high cholesterol  kidney disease  liver disease  low levels of sodium in the blood  mania or bipolar disorder  seizures  suicidal thoughts, plans, or attempt; a previous suicide attempt by you or a family  take medicines that treat or prevent blood clots  thyroid disease  an unusual or allergic reaction to venlafaxine, desvenlafaxine, other medicines, foods, dyes, or preservatives  pregnant or trying to get pregnant  breast-feeding How should I use this medicine? Take this medicine by mouth with a full glass of water. Follow the directions on the prescription label. Do not cut, crush, or chew this medicine. Take it with food. If needed, the capsule may be carefully opened and the entire contents sprinkled on a spoonful of cool applesauce. Swallow the applesauce/pellet mixture right away without chewing and follow with a glass of water to ensure complete swallowing of the pellets. Try to take your medicine at about the same time each day. Do not take your medicine more often than directed. Do not stop taking this medicine suddenly except upon the advice of your doctor. Stopping this medicine too quickly may cause serious side effects or your condition may worsen. A special MedGuide will be given to you by the pharmacist with each prescription and refill.  Be sure to read this information carefully each time. Talk to your pediatrician regarding the use of this medicine in children. Special care may be needed. Overdosage: If you think you have taken too much of this medicine contact a poison control center or emergency room at once. NOTE: This medicine is only for you. Do not share this medicine with others. What if I miss a dose? If you miss a dose, take it as soon as you can. If it is almost time for your next dose, take only that dose. Do not take  double or extra doses. What may interact with this medicine? Do not take this medicine with any of the following medications:  certain medicines for fungal infections like fluconazole, itraconazole, ketoconazole, posaconazole, voriconazole  cisapride  desvenlafaxine  dronedarone  duloxetine  levomilnacipran  linezolid  MAOIs like Carbex, Eldepryl, Marplan, Nardil, and Parnate  methylene blue (injected into a vein)  milnacipran  pimozide  thioridazine This medicine may also interact with the following medications:  amphetamines  aspirin and aspirin-like medicines  certain medicines for depression, anxiety, or psychotic disturbances  certain medicines for migraine headaches like almotriptan, eletriptan, frovatriptan, naratriptan, rizatriptan, sumatriptan, zolmitriptan  certain medicines for sleep  certain medicines that treat or prevent blood clots like dalteparin, enoxaparin, warfarin  cimetidine  clozapine  diuretics  fentanyl  furazolidone  indinavir  isoniazid  lithium  metoprolol  NSAIDS, medicines for pain and inflammation, like ibuprofen or naproxen  other medicines that prolong the QT interval (cause an abnormal heart rhythm) like dofetilide, ziprasidone  procarbazine  rasagiline  supplements like St. John's wort, kava kava, valerian  tramadol  tryptophan This list may not describe all possible interactions. Give your health care provider a list of all the medicines, herbs, non-prescription drugs, or dietary supplements you use. Also tell them if you smoke, drink alcohol, or use illegal drugs. Some items may interact with your medicine. What should I watch for while using this medicine? Tell your doctor if your symptoms do not get better or if they get worse. Visit your doctor or health care professional for regular checks on your progress. Because it may take several weeks to see the full effects of this medicine, it is important to  continue your treatment as prescribed by your doctor. Patients and their families should watch out for new or worsening thoughts of suicide or depression. Also watch out for sudden changes in feelings such as feeling anxious, agitated, panicky, irritable, hostile, aggressive, impulsive, severely restless, overly excited and hyperactive, or not being able to sleep. If this happens, especially at the beginning of treatment or after a change in dose, call your health care professional. This medicine can cause an increase in blood pressure. Check with your doctor for instructions on monitoring your blood pressure while taking this medicine. You may get drowsy or dizzy. Do not drive, use machinery, or do anything that needs mental alertness until you know how this medicine affects you. Do not stand or sit up quickly, especially if you are an older patient. This reduces the risk of dizzy or fainting spells. Alcohol may interfere with the effect of this medicine. Avoid alcoholic drinks. Your mouth may get dry. Chewing sugarless gum, sucking hard candy and drinking plenty of water will help. Contact your doctor if the problem does not go away or is severe. What side effects may I notice from receiving this medicine? Side effects that you should report to your doctor or health care professional as soon  as possible:  allergic reactions like skin rash, itching or hives, swelling of the face, lips, or tongue  anxious  breathing problems  confusion  changes in vision  chest pain  confusion  elevated mood, decreased need for sleep, racing thoughts, impulsive behavior  eye pain  fast, irregular heartbeat  feeling faint or lightheaded, falls  feeling agitated, angry, or irritable  hallucination, loss of contact with reality  high blood pressure  loss of balance or coordination  palpitations  redness, blistering, peeling or loosening of the skin, including inside the mouth  restlessness,  pacing, inability to keep still  seizures  stiff muscles  suicidal thoughts or other mood changes  trouble passing urine or change in the amount of urine  trouble sleeping  unusual bleeding or bruising  unusually weak or tired  vomiting Side effects that usually do not require medical attention (report to your doctor or health care professional if they continue or are bothersome):  change in sex drive or performance  change in appetite or weight  constipation  dizziness  dry mouth  headache  increased sweating  nausea  tired This list may not describe all possible side effects. Call your doctor for medical advice about side effects. You may report side effects to FDA at 1-800-FDA-1088. Where should I keep my medicine? Keep out of the reach of children. Store at a controlled temperature between 20 and 25 degrees C (68 degrees and 77 degrees F), in a dry place. Throw away any unused medicine after the expiration date. NOTE: This sheet is a summary. It may not cover all possible information. If you have questions about this medicine, talk to your doctor, pharmacist, or health care provider.  2020 [employer] Standard (2018-04-07 12:06:43)
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
## 2020-01-26 ENCOUNTER — Inpatient Hospital Stay: Payer: 59

## 2020-01-26 ENCOUNTER — Inpatient Hospital Stay: Payer: 59 | Admitting: Internal Medicine

## 2020-01-28 ENCOUNTER — Other Ambulatory Visit: Payer: 59

## 2020-01-31 ENCOUNTER — Ambulatory Visit (INDEPENDENT_AMBULATORY_CARE_PROVIDER_SITE_OTHER): Payer: 59 | Admitting: Licensed Clinical Social Worker

## 2020-01-31 ENCOUNTER — Other Ambulatory Visit: Payer: Self-pay

## 2020-01-31 DIAGNOSIS — F411 Generalized anxiety disorder: Secondary | ICD-10-CM

## 2020-01-31 DIAGNOSIS — F321 Major depressive disorder, single episode, moderate: Secondary | ICD-10-CM | POA: Diagnosis not present

## 2020-01-31 DIAGNOSIS — Z634 Disappearance and death of family member: Secondary | ICD-10-CM | POA: Diagnosis not present

## 2020-01-31 NOTE — Progress Notes (Signed)
Patient called for pre assessment. She states she recently lost her mom and apologized for not keeping her last appointment. She states she has some pain in left wrist due to a breakage. She denies any other concerns at this time.
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
## 2020-02-01 ENCOUNTER — Inpatient Hospital Stay: Payer: 59 | Attending: Internal Medicine

## 2020-02-01 ENCOUNTER — Inpatient Hospital Stay: Payer: 59

## 2020-02-01 ENCOUNTER — Encounter: Payer: Self-pay | Admitting: Internal Medicine

## 2020-02-01 ENCOUNTER — Inpatient Hospital Stay (HOSPITAL_BASED_OUTPATIENT_CLINIC_OR_DEPARTMENT_OTHER): Payer: 59 | Admitting: Internal Medicine

## 2020-02-01 DIAGNOSIS — Z79899 Other long term (current) drug therapy: Secondary | ICD-10-CM | POA: Diagnosis not present

## 2020-02-01 DIAGNOSIS — Z9221 Personal history of antineoplastic chemotherapy: Secondary | ICD-10-CM | POA: Diagnosis not present

## 2020-02-01 DIAGNOSIS — F32A Depression, unspecified: Secondary | ICD-10-CM | POA: Diagnosis not present

## 2020-02-01 DIAGNOSIS — C50812 Malignant neoplasm of overlapping sites of left female breast: Secondary | ICD-10-CM

## 2020-02-01 DIAGNOSIS — Z5112 Encounter for antineoplastic immunotherapy: Secondary | ICD-10-CM | POA: Insufficient documentation

## 2020-02-01 DIAGNOSIS — M25562 Pain in left knee: Secondary | ICD-10-CM | POA: Insufficient documentation

## 2020-02-01 DIAGNOSIS — Z923 Personal history of irradiation: Secondary | ICD-10-CM | POA: Diagnosis not present

## 2020-02-01 DIAGNOSIS — C50412 Malignant neoplasm of upper-outer quadrant of left female breast: Secondary | ICD-10-CM | POA: Diagnosis not present

## 2020-02-01 DIAGNOSIS — M21379 Foot drop, unspecified foot: Secondary | ICD-10-CM | POA: Insufficient documentation

## 2020-02-01 DIAGNOSIS — G629 Polyneuropathy, unspecified: Secondary | ICD-10-CM | POA: Insufficient documentation

## 2020-02-01 DIAGNOSIS — Z006 Encounter for examination for normal comparison and control in clinical research program: Secondary | ICD-10-CM | POA: Insufficient documentation

## 2020-02-01 DIAGNOSIS — Z171 Estrogen receptor negative status [ER-]: Secondary | ICD-10-CM

## 2020-02-01 LAB — CBC WITH DIFFERENTIAL/PLATELET
Abs Immature Granulocytes: 0.03 10*3/uL (ref 0.00–0.07)
Basophils Absolute: 0.1 10*3/uL (ref 0.0–0.1)
Basophils Relative: 1 %
Eosinophils Absolute: 0.1 10*3/uL (ref 0.0–0.5)
Eosinophils Relative: 1 %
HCT: 40.5 % (ref 36.0–46.0)
Hemoglobin: 14 g/dL (ref 12.0–15.0)
Immature Granulocytes: 0 %
Lymphocytes Relative: 29 %
Lymphs Abs: 3 10*3/uL (ref 0.7–4.0)
MCH: 36.3 pg — ABNORMAL HIGH (ref 26.0–34.0)
MCHC: 34.6 g/dL (ref 30.0–36.0)
MCV: 104.9 fL — ABNORMAL HIGH (ref 80.0–100.0)
Monocytes Absolute: 0.6 10*3/uL (ref 0.1–1.0)
Monocytes Relative: 6 %
Neutro Abs: 6.4 10*3/uL (ref 1.7–7.7)
Neutrophils Relative %: 63 %
Platelets: 348 10*3/uL (ref 150–400)
RBC: 3.86 MIL/uL — ABNORMAL LOW (ref 3.87–5.11)
RDW: 13.4 % (ref 11.5–15.5)
WBC: 10.1 10*3/uL (ref 4.0–10.5)
nRBC: 0 % (ref 0.0–0.2)

## 2020-02-01 LAB — COMPREHENSIVE METABOLIC PANEL
ALT: 13 U/L (ref 0–44)
AST: 21 U/L (ref 15–41)
Albumin: 3.9 g/dL (ref 3.5–5.0)
Alkaline Phosphatase: 84 U/L (ref 38–126)
Anion gap: 10 (ref 5–15)
BUN: 8 mg/dL (ref 6–20)
CO2: 25 mmol/L (ref 22–32)
Calcium: 9 mg/dL (ref 8.9–10.3)
Chloride: 105 mmol/L (ref 98–111)
Creatinine, Ser: 0.6 mg/dL (ref 0.44–1.00)
GFR calc non Af Amer: >60 mL/min (ref >60)
Glucose, Bld: 101 mg/dL — ABNORMAL HIGH (ref 70–99)
Potassium: 3.8 mmol/L (ref 3.5–5.1)
Sodium: 140 mmol/L (ref 135–145)
Total Bilirubin: 0.5 mg/dL (ref 0.3–1.2)
Total Protein: 7.4 g/dL (ref 6.5–8.1)

## 2020-02-01 MED ORDER — HEPARIN SOD (PORK) LOCK FLUSH 100 UNIT/ML IV SOLN
500.0000 [IU] | Freq: Once | INTRAVENOUS | Status: AC
  Administered 2020-02-01: 500 [IU] via INTRAVENOUS
  Filled 2020-02-01: qty 5

## 2020-02-01 MED ORDER — SODIUM CHLORIDE 0.9 % IV SOLN
Freq: Once | INTRAVENOUS | Status: AC
  Filled 2020-02-01: qty 250

## 2020-02-01 MED ORDER — DIPHENHYDRAMINE HCL 25 MG PO CAPS
50.0000 mg | ORAL_CAPSULE | Freq: Once | ORAL | Status: AC
  Administered 2020-02-01: 50 mg via ORAL
  Filled 2020-02-01: qty 2

## 2020-02-01 MED ORDER — ACETAMINOPHEN 325 MG PO TABS
650.0000 mg | ORAL_TABLET | Freq: Once | ORAL | Status: AC
  Administered 2020-02-01: 650 mg via ORAL
  Filled 2020-02-01: qty 2

## 2020-02-01 MED ORDER — HEPARIN SOD (PORK) LOCK FLUSH 100 UNIT/ML IV SOLN
500.0000 [IU] | Freq: Once | INTRAVENOUS | Status: DC | PRN
  Filled 2020-02-01: qty 5

## 2020-02-01 MED ORDER — TRASTUZUMAB-ANNS CHEMO 150 MG IV SOLR
300.0000 mg | Freq: Once | INTRAVENOUS | Status: AC
  Administered 2020-02-01: 300 mg via INTRAVENOUS
  Filled 2020-02-01: qty 14.29

## 2020-02-01 MED ORDER — HEPARIN SOD (PORK) LOCK FLUSH 100 UNIT/ML IV SOLN
INTRAVENOUS | Status: AC
  Filled 2020-02-01: qty 5

## 2020-02-01 MED ORDER — SODIUM CHLORIDE 0.9% FLUSH
10.0000 mL | INTRAVENOUS | Status: DC | PRN
  Administered 2020-02-01: 10 mL via INTRAVENOUS
  Filled 2020-02-01: qty 10

## 2020-02-01 MED ORDER — SODIUM CHLORIDE 0.9 % IV SOLN
420.0000 mg | Freq: Once | INTRAVENOUS | Status: AC
  Administered 2020-02-01: 420 mg via INTRAVENOUS
  Filled 2020-02-01: qty 14
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
## 2020-02-01 NOTE — Assessment & Plan Note (Addendum)
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 On mainatence Herceptin-Perjeta.  S/p radiation On mainatence Herceptin-Perjeta. STABLE.  # Proceed with herceptin-Perjeta. Labs today reviewed;  acceptable for treatment today. MUGA  RXV'40- 54%; may  2021- 52%  STABLE>    # PN-[clinical trial]-grade-1; STABLE; [poor tol to gabapentin]; s/p accu puncture x 2.   # S/p fall- left knee pain-continue as needed NSAIDs.  # Depression/Anxiety/insomnia/chemo brain-  on  Remeron to 30 mg qhs. STABLE; reviewed note from psychiatry-also added Effexor; awaiting counseling.  # DISPOSITION:  # treatment today # follow up in 3 weeks MD;  labs-cbc/cmp; Perjeta-Herceptin;--Dr.B

## 2020-02-04 ENCOUNTER — Other Ambulatory Visit: Payer: 59

## 2020-02-16 ENCOUNTER — Other Ambulatory Visit: Payer: Self-pay

## 2020-02-16 ENCOUNTER — Ambulatory Visit (INDEPENDENT_AMBULATORY_CARE_PROVIDER_SITE_OTHER): Payer: 59 | Admitting: Licensed Clinical Social Worker

## 2020-02-16 DIAGNOSIS — F321 Major depressive disorder, single episode, moderate: Secondary | ICD-10-CM | POA: Diagnosis not present

## 2020-02-16 DIAGNOSIS — Z634 Disappearance and death of family member: Secondary | ICD-10-CM

## 2020-02-16 DIAGNOSIS — F411 Generalized anxiety disorder: Secondary | ICD-10-CM | POA: Diagnosis not present

## 2020-02-16 NOTE — Progress Notes (Signed)
Virtual Visit via Video Note  I connected with [name] on 02/16/20 at 10:00 AM EDT by a video enabled telemedicine application and verified that I am speaking with the correct person using two identifiers.  Location: Patient: home Provider: remote office [place], [place])   I discussed the limitations of evaluation and management by telemedicine and the availability of in person appointments. The patient expressed understanding and agreed to proceed.   The patient was advised to call back or seek an in-person evaluation if the symptoms worsen or if the condition fails to improve as anticipated.  I provided 39 minutes of non-face-to-face time during this encounter.   Franki Alcaide R Desree Leap, LCSW    THERAPIST PROGRESS NOTE  Session Time: 10:00-10:39  Participation Level: Active  Behavioral Response: NeatAlertAnxious and Depressed  Type of Therapy: Individual Therapy  Treatment Goals addressed: Anxiety and Coping  Interventions: CBT and Supportive  Summary: [name] is a 49 y.o. female who presents with fluctuating symptoms related to depression and anxiety. Pt reports some mood swings, feeling sad, nervousness/anxiety. Pt reports that most days she gets good quality and quantity of sleep.   Allowed pt to explore and express thoughts and feelings that are triggering current stress: loss of mother and relationship with sister. Many of the symptoms are triggered by days when patient feels overwhelmed by the amount of duties and activities that she and her sister have to complete after the death of her mother. Pt and sister are both co-executors of the will.  Reviewed importance of overall self care, life balance, and allowing pt to grieve. Discussed healthy versus unhealthy ways to cope.    Suicidal/Homicidal: No  SI, HI, or AVH reported at time of session.  Therapist Response: [name] reports fluctuating improvements in symptoms, which is indicative of  fluctuating/intermittent progress.   Plan: Return again in 3 weeks. The ongoing treatment plan includes maintaining current levels of progress and continuing to build skills to manage mood, improve stress/anxiety management grief counseling, emotion regulation, distress tolerance, and behavior modification.   Diagnosis: Axis I: Major depressive disorder, moderate; Generalized anxiety disorder, Bereavement    Axis II: No diagnosis    [name], LCSW 02/16/2020

## 2020-02-22 ENCOUNTER — Encounter: Payer: Self-pay | Admitting: Internal Medicine

## 2020-02-22 ENCOUNTER — Inpatient Hospital Stay: Payer: 59

## 2020-02-22 ENCOUNTER — Inpatient Hospital Stay (HOSPITAL_BASED_OUTPATIENT_CLINIC_OR_DEPARTMENT_OTHER): Payer: 59 | Admitting: Internal Medicine

## 2020-02-22 ENCOUNTER — Other Ambulatory Visit: Payer: Self-pay

## 2020-02-22 ENCOUNTER — Other Ambulatory Visit: Payer: Self-pay | Admitting: *Deleted

## 2020-02-22 DIAGNOSIS — Z79899 Other long term (current) drug therapy: Secondary | ICD-10-CM

## 2020-02-22 DIAGNOSIS — Z006 Encounter for examination for normal comparison and control in clinical research program: Secondary | ICD-10-CM | POA: Diagnosis not present

## 2020-02-22 DIAGNOSIS — Z9221 Personal history of antineoplastic chemotherapy: Secondary | ICD-10-CM

## 2020-02-22 DIAGNOSIS — Z5112 Encounter for antineoplastic immunotherapy: Secondary | ICD-10-CM | POA: Diagnosis not present

## 2020-02-22 DIAGNOSIS — C50412 Malignant neoplasm of upper-outer quadrant of left female breast: Secondary | ICD-10-CM | POA: Diagnosis not present

## 2020-02-22 DIAGNOSIS — Z923 Personal history of irradiation: Secondary | ICD-10-CM

## 2020-02-22 DIAGNOSIS — M25562 Pain in left knee: Secondary | ICD-10-CM

## 2020-02-22 DIAGNOSIS — Z171 Estrogen receptor negative status [ER-]: Secondary | ICD-10-CM

## 2020-02-22 DIAGNOSIS — F32A Depression, unspecified: Secondary | ICD-10-CM

## 2020-02-22 DIAGNOSIS — M21379 Foot drop, unspecified foot: Secondary | ICD-10-CM

## 2020-02-22 DIAGNOSIS — C50812 Malignant neoplasm of overlapping sites of left female breast: Secondary | ICD-10-CM

## 2020-02-22 DIAGNOSIS — G629 Polyneuropathy, unspecified: Secondary | ICD-10-CM

## 2020-02-22 LAB — CBC WITH DIFFERENTIAL/PLATELET
Abs Immature Granulocytes: 0.04 10*3/uL (ref 0.00–0.07)
Basophils Absolute: 0.1 10*3/uL (ref 0.0–0.1)
Basophils Relative: 1 %
Eosinophils Absolute: 0 10*3/uL (ref 0.0–0.5)
Eosinophils Relative: 0 %
HCT: 44 % (ref 36.0–46.0)
Hemoglobin: 15 g/dL (ref 12.0–15.0)
Immature Granulocytes: 0 %
Lymphocytes Relative: 22 %
Lymphs Abs: 2.8 10*3/uL (ref 0.7–4.0)
MCH: 35 pg — ABNORMAL HIGH (ref 26.0–34.0)
MCHC: 34.1 g/dL (ref 30.0–36.0)
MCV: 102.6 fL — ABNORMAL HIGH (ref 80.0–100.0)
Monocytes Absolute: 0.9 10*3/uL (ref 0.1–1.0)
Monocytes Relative: 7 %
Neutro Abs: 8.9 10*3/uL — ABNORMAL HIGH (ref 1.7–7.7)
Neutrophils Relative %: 70 %
Platelets: 325 10*3/uL (ref 150–400)
RBC: 4.29 MIL/uL (ref 3.87–5.11)
RDW: 12.8 % (ref 11.5–15.5)
WBC: 12.8 10*3/uL — ABNORMAL HIGH (ref 4.0–10.5)
nRBC: 0 % (ref 0.0–0.2)

## 2020-02-22 LAB — COMPREHENSIVE METABOLIC PANEL
ALT: 14 U/L (ref 0–44)
AST: 22 U/L (ref 15–41)
Albumin: 4.1 g/dL (ref 3.5–5.0)
Alkaline Phosphatase: 78 U/L (ref 38–126)
Anion gap: 11 (ref 5–15)
BUN: 10 mg/dL (ref 6–20)
CO2: 24 mmol/L (ref 22–32)
Calcium: 9.1 mg/dL (ref 8.9–10.3)
Chloride: 104 mmol/L (ref 98–111)
Creatinine, Ser: 0.72 mg/dL (ref 0.44–1.00)
GFR, Estimated: >60 mL/min (ref >60)
Glucose, Bld: 98 mg/dL (ref 70–99)
Potassium: 3.7 mmol/L (ref 3.5–5.1)
Sodium: 139 mmol/L (ref 135–145)
Total Bilirubin: 0.5 mg/dL (ref 0.3–1.2)
Total Protein: 7.6 g/dL (ref 6.5–8.1)

## 2020-02-22 MED ORDER — TRASTUZUMAB-ANNS CHEMO 150 MG IV SOLR
300.0000 mg | Freq: Once | INTRAVENOUS | Status: AC
  Administered 2020-02-22: 300 mg via INTRAVENOUS
  Filled 2020-02-22: qty 14.29

## 2020-02-22 MED ORDER — HEPARIN SOD (PORK) LOCK FLUSH 100 UNIT/ML IV SOLN
INTRAVENOUS | Status: AC
  Filled 2020-02-22: qty 5

## 2020-02-22 MED ORDER — HEPARIN SOD (PORK) LOCK FLUSH 100 UNIT/ML IV SOLN
500.0000 [IU] | Freq: Once | INTRAVENOUS | Status: AC | PRN
  Administered 2020-02-22: 500 [IU]
  Filled 2020-02-22: qty 5

## 2020-02-22 MED ORDER — ACETAMINOPHEN 325 MG PO TABS
650.0000 mg | ORAL_TABLET | Freq: Once | ORAL | Status: AC
  Administered 2020-02-22: 650 mg via ORAL
  Filled 2020-02-22: qty 2

## 2020-02-22 MED ORDER — SODIUM CHLORIDE 0.9% FLUSH
10.0000 mL | INTRAVENOUS | Status: DC | PRN
  Administered 2020-02-22: 10 mL via INTRAVENOUS
  Filled 2020-02-22: qty 10

## 2020-02-22 MED ORDER — HEPARIN SOD (PORK) LOCK FLUSH 100 UNIT/ML IV SOLN
500.0000 [IU] | Freq: Once | INTRAVENOUS | Status: DC
  Filled 2020-02-22: qty 5

## 2020-02-22 MED ORDER — SODIUM CHLORIDE 0.9 % IV SOLN
420.0000 mg | Freq: Once | INTRAVENOUS | Status: AC
  Administered 2020-02-22: 420 mg via INTRAVENOUS
  Filled 2020-02-22: qty 14

## 2020-02-22 MED ORDER — SODIUM CHLORIDE 0.9 % IV SOLN
Freq: Once | INTRAVENOUS | Status: AC
  Filled 2020-02-22: qty 250

## 2020-02-22 MED ORDER — DIPHENHYDRAMINE HCL 25 MG PO CAPS
50.0000 mg | ORAL_CAPSULE | Freq: Once | ORAL | Status: AC
  Administered 2020-02-22: 50 mg via ORAL
  Filled 2020-02-22: qty 2
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
## 2020-02-22 NOTE — Research (Addendum)
SWOG M8413 52 Week Protocol Visit:   Patient in to cancer center unaccompanied for her scheduled Week 52 visit with Dr. Rogue Bussing.  The patient completed her QOL questionnaires prior to meeting with Dr. Rogue Bussing. Solicited AE's reviewed by Dr. 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  Dr. Rogue Bussing completed the Follow-Up Physician Assessment for toxicity burden related to the affects on her daily life with a score of 6 (range from 0-10, 10 is the most severe). The patient states that she has real difficulty with ADL's and is unable to carry anything in her left hand. She states the neuropathy symptoms are worse in her left hand and foot. She did inform Dr. Rogue Bussing that she feels like her big toes are falling and they are numb, she is tripping over them all the time. Dr. Rogue Bussing also performed strength assessments to her lower extremities. The neuropen, tuning fork and timed get up and go assessments were completed  without any difficulties. The patient was unable to feel any sensation to her third toe with the monofilament or the neurotip. Dr. Rogue Bussing has recommended a referral to physical therapy for assessment and possible strengthening exercises for her lower extremities. The patient is unable to take Gabapentin ( Neurontin ) due to toxicities in the past. The patient states she is also scheduled to have a nerve conduction study of her left wrist next week, Dr. Rogue Bussing suggested she inform her provider at [facility] of these issues at that time as well. The timeline for the protocol was reviewed and the next assessment for protocol will be for a 104 week visit. Explained to the patient that we would try and complete that visit with her regularly scheduled appointments for continued follow up. The patient was  encouraged to call the research nurse for any questions or concerns. A total of one hour was spent with the patient for all required protocol activities.  Jeral Fruit, RN, BSN, OCN 02/22/2020 0955 am  Adverse Event Log  Study/Protocol: SWOG S1714   Week 52  Event Grade Onset Date Resolved Date Drug Name Attribution Treatment Comments  Sensory Peripheral Neuropathy 1 05/03/2019  Taxol Definitely    Constipation 1 05/03/2019 08/20/2019 Taxol Possible    Pain- abdominal 1 05/03/2019 08/20/2019  Possible    Pain 1 08/20/2019 02/22/2020  Unrelated Tylenol Post Lumpectomy   Fatigue 1 08/20/2019  Taxol Definately    Peripheral Motor Neuropathy 3 02/22/2020  Taxol Definitely    Paresthesia 1 02/22/2020  Taxol Definitely

## 2020-02-22 NOTE — Assessment & Plan Note (Addendum)
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 On mainatence Herceptin-Perjeta.  S/p radiation On mainatence Herceptin-Perjeta. STABLE.   # Proceed with herceptin-Perjeta. Labs today reviewed;  acceptable for treatment today. MUGA  WOE'32- 54%; may  2021- 52%  STABLE>    # PN-[clinical trial]-grade-2-3; worse- ? Foot drop [poor tol to gabapentin]; s/p accu puncture x 2.  Will refer to moderate occupational therapy.  # S/p fall- left knee pain-continue as needed NSAIDs.  # Depression/Anxiety/insomnia/chemo brain-  on  Remeron to 30 mg qhs. STABLE; eviewed note from psychiatry-on  Effexor;s/p counseling.  # DISPOSITION: # Referral to [facility]/OT re: foot drop  # treatment today # follow up in 3 weeks MD;  labs-cbc/cmp Perjeta-Herceptin;--Dr.B

## 2020-02-23 ENCOUNTER — Other Ambulatory Visit: Payer: Self-pay

## 2020-02-23 ENCOUNTER — Encounter: Payer: Self-pay | Admitting: Psychiatry

## 2020-02-23 ENCOUNTER — Telehealth (INDEPENDENT_AMBULATORY_CARE_PROVIDER_SITE_OTHER): Payer: 59 | Admitting: Psychiatry

## 2020-02-23 ENCOUNTER — Inpatient Hospital Stay: Payer: 59 | Admitting: Occupational Therapy

## 2020-02-23 DIAGNOSIS — F411 Generalized anxiety disorder: Secondary | ICD-10-CM

## 2020-02-23 DIAGNOSIS — F321 Major depressive disorder, single episode, moderate: Secondary | ICD-10-CM

## 2020-02-23 DIAGNOSIS — Z634 Disappearance and death of family member: Secondary | ICD-10-CM | POA: Diagnosis not present

## 2020-02-23 DIAGNOSIS — M25675 Stiffness of left foot, not elsewhere classified: Secondary | ICD-10-CM

## 2020-02-23 MED ORDER — VENLAFAXINE HCL ER 75 MG PO CP24: 75.0000 mg | ORAL_CAPSULE | Freq: Every day | ORAL | 1 refills | Status: DC

## 2020-02-23 MED ORDER — MIRTAZAPINE 15 MG PO TABS: 15.0000 mg | ORAL_TABLET | Freq: Every day | ORAL | 1 refills | Status: DC

## 2020-02-23 MED ORDER — TRAZODONE HCL 50 MG PO TABS: 50.0000 mg | ORAL_TABLET | Freq: Every evening | ORAL | 1 refills | Status: DC | PRN
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
## 2020-02-23 NOTE — Patient Instructions (Signed)
Trazodone tablets What is this medicine? TRAZODONE (TRAZ oh done) is used to treat depression. This medicine may be used for other purposes; ask your health care provider or pharmacist if you have questions. COMMON BRAND NAME(S): Desyrel What should I tell my health care provider before I take this medicine? They need to know if you have any of these conditions:  attempted suicide or thinking about it  bipolar disorder  bleeding problems  glaucoma  heart disease, or previous heart attack  irregular heart beat  kidney or liver disease  low levels of sodium in the blood  an unusual or allergic reaction to trazodone, other medicines, foods, dyes or preservatives  pregnant or trying to get pregnant  breast-feeding How should I use this medicine? Take this medicine by mouth with a glass of water. Follow the directions on the prescription label. Take this medicine shortly after a meal or a light snack. Take your medicine at regular intervals. Do not take your medicine more often than directed. Do not stop taking this medicine suddenly except upon the advice of your doctor. Stopping this medicine too quickly may cause serious side effects or your condition may worsen. A special MedGuide will be given to you by the pharmacist with each prescription and refill. Be sure to read this information carefully each time. Talk to your pediatrician regarding the use of this medicine in children. Special care may be needed. Overdosage: If you think you have taken too much of this medicine contact a poison control center or emergency room at once. NOTE: This medicine is only for you. Do not share this medicine with others. What if I miss a dose? If you miss a dose, take it as soon as you can. If it is almost time for your next dose, take only that dose. Do not take double or extra doses. What may interact with this medicine? Do not take this medicine with any of the following  medications:  certain medicines for fungal infections like fluconazole, itraconazole, ketoconazole, posaconazole, voriconazole  cisapride  dronedarone  linezolid  MAOIs like Carbex, Eldepryl, Marplan, Nardil, and Parnate  mesoridazine  methylene blue (injected into a vein)  pimozide  saquinavir  thioridazine This medicine may also interact with the following medications:  alcohol  antiviral medicines for HIV or AIDS  aspirin and aspirin-like medicines  barbiturates like phenobarbital  certain medicines for blood pressure, heart disease, irregular heart beat  certain medicines for depression, anxiety, or psychotic disturbances  certain medicines for migraine headache like almotriptan, eletriptan, frovatriptan, naratriptan, rizatriptan, sumatriptan, zolmitriptan  certain medicines for seizures like carbamazepine and phenytoin  certain medicines for sleep  certain medicines that treat or prevent blood clots like dalteparin, enoxaparin, warfarin  digoxin  fentanyl  lithium  NSAIDS, medicines for pain and inflammation, like ibuprofen or naproxen  other medicines that prolong the QT interval (cause an abnormal heart rhythm) like dofetilide  rasagiline  supplements like St. John's wort, kava kava, valerian  tramadol  tryptophan This list may not describe all possible interactions. Give your health care provider a list of all the medicines, herbs, non-prescription drugs, or dietary supplements you use. Also tell them if you smoke, drink alcohol, or use illegal drugs. Some items may interact with your medicine. What should I watch for while using this medicine? Tell your doctor if your symptoms do not get better or if they get worse. Visit your doctor or health care professional for regular checks on your progress. Because it may take   several weeks to see the full effects of this medicine, it is important to continue your treatment as prescribed by your  doctor. Patients and their families should watch out for new or worsening thoughts of suicide or depression. Also watch out for sudden changes in feelings such as feeling anxious, agitated, panicky, irritable, hostile, aggressive, impulsive, severely restless, overly excited and hyperactive, or not being able to sleep. If this happens, especially at the beginning of treatment or after a change in dose, call your health care professional. You may get drowsy or dizzy. Do not drive, use machinery, or do anything that needs mental alertness until you know how this medicine affects you. Do not stand or sit up quickly, especially if you are an older patient. This reduces the risk of dizzy or fainting spells. Alcohol may interfere with the effect of this medicine. Avoid alcoholic drinks. This medicine may cause dry eyes and blurred vision. If you wear contact lenses you may feel some discomfort. Lubricating drops may help. See your eye doctor if the problem does not go away or is severe. Your mouth may get dry. Chewing sugarless gum, sucking hard candy and drinking plenty of water may help. Contact your doctor if the problem does not go away or is severe. What side effects may I notice from receiving this medicine? Side effects that you should report to your doctor or health care professional as soon as possible:  allergic reactions like skin rash, itching or hives, swelling of the face, lips, or tongue  elevated mood, decreased need for sleep, racing thoughts, impulsive behavior  confusion  fast, irregular heartbeat  feeling faint or lightheaded, falls  feeling agitated, angry, or irritable  loss of balance or coordination  painful or prolonged erections  restlessness, pacing, inability to keep still  suicidal thoughts or other mood changes  tremors  trouble sleeping  seizures  unusual bleeding or bruising Side effects that usually do not require medical attention (report to your doctor  or health care professional if they continue or are bothersome):  change in sex drive or performance  change in appetite or weight  constipation  headache  muscle aches or pains  nausea This list may not describe all possible side effects. Call your doctor for medical advice about side effects. You may report side effects to FDA at 1-800-FDA-1088. Where should I keep my medicine? Keep out of the reach of children. Store at room temperature between 15 and 30 degrees C (59 to 86 degrees F). Protect from light. Keep container tightly closed. Throw away any unused medicine after the expiration date. NOTE: This sheet is a summary. It may not cover all possible information. If you have questions about this medicine, talk to your doctor, pharmacist, or health care provider.  2020 Elsevier/Gold Standard (2018-04-07 11:46:46)  
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
## 2020-03-02 ENCOUNTER — Ambulatory Visit
Admission: RE | Admit: 2020-03-02 | Discharge: 2020-03-02 | Disposition: A | Discharge status: Home / Self Care | Payer: 59 | Source: Ambulatory Visit | Attending: Surgery | Admitting: Surgery

## 2020-03-02 ENCOUNTER — Other Ambulatory Visit: Payer: Self-pay

## 2020-03-02 DIAGNOSIS — Z853 Personal history of malignant neoplasm of breast: Secondary | ICD-10-CM

## 2020-03-02 HISTORY — DX: Personal history of irradiation: Z92.3

## 2020-03-08 ENCOUNTER — Ambulatory Visit: Payer: 59 | Admitting: Occupational Therapy

## 2020-03-08 ENCOUNTER — Other Ambulatory Visit: Payer: Self-pay

## 2020-03-08 ENCOUNTER — Ambulatory Visit (INDEPENDENT_AMBULATORY_CARE_PROVIDER_SITE_OTHER): Payer: 59 | Admitting: Licensed Clinical Social Worker

## 2020-03-08 DIAGNOSIS — F411 Generalized anxiety disorder: Secondary | ICD-10-CM | POA: Diagnosis not present

## 2020-03-08 DIAGNOSIS — F321 Major depressive disorder, single episode, moderate: Secondary | ICD-10-CM

## 2020-03-08 DIAGNOSIS — Z634 Disappearance and death of family member: Secondary | ICD-10-CM | POA: Diagnosis not present

## 2020-03-08 NOTE — Progress Notes (Signed)
Virtual Visit via Video Note  I connected with [name] on 03/08/20 at 12:30 PM EST by a video enabled telemedicine application and verified that I am speaking with the correct person using two identifiers.  Location: Patient: home Provider: remote office [place], [place])   I discussed the limitations of evaluation and management by telemedicine and the availability of in person appointments. The patient expressed understanding and agreed to proceed.  The patient was advised to call back or seek an in-person evaluation if the symptoms worsen or if the condition fails to improve as anticipated.  I provided 45 minutes of non-face-to-face time during this encounter.   [place], LCSW    THERAPIST PROGRESS NOTE  Session Time: 12:30-1:15p  Participation Level: Active  Behavioral Response: Neat and Well GroomedAlertAnxious and Depressed  Type of Therapy: Individual Therapy  Treatment Goals addressed: Anxiety and Coping  Interventions: CBT, Solution Focused and Supportive  Summary: [name] is a 49 y.o. female who presents with continuing symptoms related to depression/bereavement diagnosis. Pt also reporting occasional anxiety symptoms.  Pt reports fair quality and quantity of sleep.  Allowed pt to explore and express continuing feelings of grief over recent family losses and overall psychological impact. Pt still gets very tearful when discussing the loss of both parents. Allowed pt to identify and feel grief and explore what life looked life at time of death for both parents. Pt shared her story (with lots of emotion).   Discussed the grief process and what an individual path it is--not to give in to society's "should's".  Encouraged pt to focus on self care and overall life balance/management.   Suicidal/Homicidal: No  SI, HI, or AVH reported at time of session.  Therapist Response: [name] reports that symptoms are steady, which is reflective of  intermittent/fluctuating progress. Treatment to continue as indicated.   Plan: Return again in 3 weeks. The ongoing treatment plan includes maintaining current levels of progress and continuing to build skills to manage mood, improve stress/anxiety management, emotion regulation, distress tolerance, and behavior modification.   Diagnosis: Axis I: Major depressive disorder, recurrent, moderate; Generalized anxiety disorder; bereavement    Axis II: No diagnosis    Rachel Bo Obed Samek, LCSW 03/08/2020

## 2020-03-14 ENCOUNTER — Inpatient Hospital Stay: Payer: 59 | Attending: Internal Medicine

## 2020-03-14 ENCOUNTER — Encounter: Payer: Self-pay | Admitting: Internal Medicine

## 2020-03-14 ENCOUNTER — Inpatient Hospital Stay: Payer: 59

## 2020-03-14 ENCOUNTER — Other Ambulatory Visit: Payer: Self-pay

## 2020-03-14 ENCOUNTER — Inpatient Hospital Stay (HOSPITAL_BASED_OUTPATIENT_CLINIC_OR_DEPARTMENT_OTHER): Payer: 59 | Admitting: Internal Medicine

## 2020-03-14 DIAGNOSIS — Z79899 Other long term (current) drug therapy: Secondary | ICD-10-CM | POA: Insufficient documentation

## 2020-03-14 DIAGNOSIS — Z171 Estrogen receptor negative status [ER-]: Secondary | ICD-10-CM

## 2020-03-14 DIAGNOSIS — Z5112 Encounter for antineoplastic immunotherapy: Secondary | ICD-10-CM | POA: Insufficient documentation

## 2020-03-14 DIAGNOSIS — C50812 Malignant neoplasm of overlapping sites of left female breast: Secondary | ICD-10-CM | POA: Diagnosis not present

## 2020-03-14 DIAGNOSIS — Z5181 Encounter for therapeutic drug level monitoring: Secondary | ICD-10-CM

## 2020-03-14 LAB — COMPREHENSIVE METABOLIC PANEL
ALT: 12 U/L (ref 0–44)
AST: 20 U/L (ref 15–41)
Albumin: 3.8 g/dL (ref 3.5–5.0)
Alkaline Phosphatase: 68 U/L (ref 38–126)
Anion gap: 10 (ref 5–15)
BUN: 12 mg/dL (ref 6–20)
CO2: 23 mmol/L (ref 22–32)
Calcium: 8.9 mg/dL (ref 8.9–10.3)
Chloride: 105 mmol/L (ref 98–111)
Creatinine, Ser: 0.66 mg/dL (ref 0.44–1.00)
GFR, Estimated: >60 mL/min (ref >60)
Glucose, Bld: 101 mg/dL — ABNORMAL HIGH (ref 70–99)
Potassium: 3.8 mmol/L (ref 3.5–5.1)
Sodium: 138 mmol/L (ref 135–145)
Total Bilirubin: 0.4 mg/dL (ref 0.3–1.2)
Total Protein: 7.1 g/dL (ref 6.5–8.1)

## 2020-03-14 LAB — CBC WITH DIFFERENTIAL/PLATELET
Abs Immature Granulocytes: 0.02 10*3/uL (ref 0.00–0.07)
Basophils Absolute: 0.1 10*3/uL (ref 0.0–0.1)
Basophils Relative: 1 %
Eosinophils Absolute: 0.1 10*3/uL (ref 0.0–0.5)
Eosinophils Relative: 1 %
HCT: 40.5 % (ref 36.0–46.0)
Hemoglobin: 14 g/dL (ref 12.0–15.0)
Immature Granulocytes: 0 %
Lymphocytes Relative: 35 %
Lymphs Abs: 3.2 10*3/uL (ref 0.7–4.0)
MCH: 35.1 pg — ABNORMAL HIGH (ref 26.0–34.0)
MCHC: 34.6 g/dL (ref 30.0–36.0)
MCV: 101.5 fL — ABNORMAL HIGH (ref 80.0–100.0)
Monocytes Absolute: 0.8 10*3/uL (ref 0.1–1.0)
Monocytes Relative: 9 %
Neutro Abs: 5 10*3/uL (ref 1.7–7.7)
Neutrophils Relative %: 54 %
Platelets: 297 10*3/uL (ref 150–400)
RBC: 3.99 MIL/uL (ref 3.87–5.11)
RDW: 12.6 % (ref 11.5–15.5)
WBC: 9.1 10*3/uL (ref 4.0–10.5)
nRBC: 0 % (ref 0.0–0.2)

## 2020-03-14 MED ORDER — SODIUM CHLORIDE 0.9 % IV SOLN
Freq: Once | INTRAVENOUS | Status: AC
  Filled 2020-03-14: qty 250

## 2020-03-14 MED ORDER — SODIUM CHLORIDE 0.9% FLUSH
10.0000 mL | INTRAVENOUS | Status: DC | PRN
  Administered 2020-03-14 (×2): 10 mL
  Filled 2020-03-14: qty 10

## 2020-03-14 MED ORDER — TRASTUZUMAB-ANNS CHEMO 150 MG IV SOLR
340.0000 mg | Freq: Once | INTRAVENOUS | Status: AC
  Administered 2020-03-14: 340 mg via INTRAVENOUS
  Filled 2020-03-14: qty 16.19

## 2020-03-14 MED ORDER — ACETAMINOPHEN 325 MG PO TABS
650.0000 mg | ORAL_TABLET | Freq: Once | ORAL | Status: AC
  Administered 2020-03-14: 650 mg via ORAL
  Filled 2020-03-14: qty 2

## 2020-03-14 MED ORDER — HEPARIN SOD (PORK) LOCK FLUSH 100 UNIT/ML IV SOLN
INTRAVENOUS | Status: AC
  Filled 2020-03-14: qty 5

## 2020-03-14 MED ORDER — DIPHENHYDRAMINE HCL 25 MG PO CAPS
50.0000 mg | ORAL_CAPSULE | Freq: Once | ORAL | Status: AC
  Administered 2020-03-14: 50 mg via ORAL
  Filled 2020-03-14: qty 2

## 2020-03-14 MED ORDER — SODIUM CHLORIDE 0.9 % IV SOLN
420.0000 mg | Freq: Once | INTRAVENOUS | Status: AC
  Administered 2020-03-14: 420 mg via INTRAVENOUS
  Filled 2020-03-14: qty 14

## 2020-03-14 MED ORDER — HEPARIN SOD (PORK) LOCK FLUSH 100 UNIT/ML IV SOLN
500.0000 [IU] | Freq: Once | INTRAVENOUS | Status: AC | PRN
  Administered 2020-03-14: 500 [IU]
  Filled 2020-03-14: qty 5

## 2020-03-14 MED ORDER — SODIUM CHLORIDE 0.9% FLUSH
3.0000 mL | INTRAVENOUS | Status: DC | PRN
  Filled 2020-03-14: qty 3

## 2020-03-14 MED ORDER — SODIUM CHLORIDE 0.9% FLUSH
10.0000 mL | Freq: Once | INTRAVENOUS | Status: AC
  Administered 2020-03-14: 10 mL via INTRAVENOUS
  Filled 2020-03-14: qty 10

## 2020-03-14 NOTE — Assessment & Plan Note (Signed)
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 On mainatence Herceptin-Perjeta.  S/p radiation On mainatence Herceptin-Perjeta. STABLE.   # Proceed with herceptin-Perjeta- plan 16th of planned 17 today. Will check with pharmacy.  Labs today reviewed;  acceptable for treatment today. MUGA  DUK'02- 54%; may  2021- 52%; STABLE>   # PN-[clinical trial]-grade-2; STABLE.? Foot drop [poor tol to gabapentin]; s/p accu puncture x 2.  Awaiting EMG with Dr.Shah [dec 15th].   S/p fall- left knee pain-continue as needed NSAIDs.  # Depression/Anxiety/insomnia/chemo brain-  on  Remeron to 30 mg qhs/ Effexo.  STABLE>   # DISPOSITION: # treatment today # follow up in 3 weeks MD;  labs-cbc/cmp Perjeta-Herceptin;--Dr.B
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
## 2020-03-27 ENCOUNTER — Telehealth (INDEPENDENT_AMBULATORY_CARE_PROVIDER_SITE_OTHER): Payer: 59 | Admitting: Psychiatry

## 2020-03-27 ENCOUNTER — Encounter: Payer: Self-pay | Admitting: Psychiatry

## 2020-03-27 ENCOUNTER — Other Ambulatory Visit: Payer: Self-pay

## 2020-03-27 DIAGNOSIS — F17200 Nicotine dependence, unspecified, uncomplicated: Secondary | ICD-10-CM

## 2020-03-27 DIAGNOSIS — Z634 Disappearance and death of family member: Secondary | ICD-10-CM

## 2020-03-27 DIAGNOSIS — F411 Generalized anxiety disorder: Secondary | ICD-10-CM

## 2020-03-27 DIAGNOSIS — F321 Major depressive disorder, single episode, moderate: Secondary | ICD-10-CM

## 2020-03-27 MED ORDER — HYDROXYZINE HCL 25 MG PO TABS: 25.0000 mg | ORAL_TABLET | Freq: Three times a day (TID) | ORAL | 1 refills | Status: DC | PRN

## 2020-03-27 MED ORDER — VENLAFAXINE HCL ER 75 MG PO CP24: 75.0000 mg | ORAL_CAPSULE | Freq: Every day | ORAL | 1 refills | Status: DC

## 2020-03-27 MED ORDER — MIRTAZAPINE 15 MG PO TABS: 15.0000 mg | ORAL_TABLET | Freq: Every day | ORAL | 1 refills | Status: DC

## 2020-03-27 MED ORDER — TRAZODONE HCL 50 MG PO TABS: 50.0000 mg | ORAL_TABLET | Freq: Every evening | ORAL | 1 refills | Status: DC | PRN

## 2020-03-27 MED ORDER — VARENICLINE TARTRATE 0.5 MG PO TABS: 0.5000 mg | ORAL_TABLET | Freq: Two times a day (BID) | ORAL | 1 refills | Status: DC

## 2020-03-27 NOTE — Patient Instructions (Signed)
Varenicline oral tablets What is this medicine? VARENICLINE (var e NI kleen) is used to help people quit smoking. It is used with a patient support program recommended by your physician. This medicine may be used for other purposes; ask your health care provider or pharmacist if you have questions. COMMON BRAND NAME(S): Chantix What should I tell my health care provider before I take this medicine? They need to know if you have any of these conditions:  heart disease  if you often drink alcohol  kidney disease  mental illness  on hemodialysis  seizures  history of stroke  suicidal thoughts, plans, or attempt; a previous suicide attempt by you or a family member  an unusual or allergic reaction to varenicline, other medicines, foods, dyes, or preservatives  pregnant or trying to get pregnant  breast-feeding How should I use this medicine? Take this medicine by mouth after eating. Take with a full glass of water. Follow the directions on the prescription label. Take your doses at regular intervals. Do not take your medicine more often than directed. There are 3 ways you can use this medicine to help you quit smoking; talk to your health care professional to decide which plan is right for you: 1) you can choose a quit date and start this medicine 1 week before the quit date, or, 2) you can start taking this medicine before you choose a quit date, and then pick a quit date between day 8 and 35 days of treatment, or, 3) if you are not sure that you are able or willing to quit smoking right away, start taking this medicine and slowly decrease the amount you smoke as directed by your health care professional with the goal of being cigarette-free by week 12 of treatment. Stick to your plan; ask about support groups or other ways to help you remain cigarette-free. If you are motivated to quit smoking and did not succeed during a previous attempt with this medicine for reasons other than  side effects, or if you returned to smoking after this treatment, speak with your health care professional about whether another course of this medicine may be right for you. A special MedGuide will be given to you by the pharmacist with each prescription and refill. Be sure to read this information carefully each time. Talk to your pediatrician regarding the use of this medicine in children. This medicine is not approved for use in children. Overdosage: If you think you have taken too much of this medicine contact a poison control center or emergency room at once. NOTE: This medicine is only for you. Do not share this medicine with others. What if I miss a dose? If you miss a dose, take it as soon as you can. If it is almost time for your next dose, take only that dose. Do not take double or extra doses. What may interact with this medicine?  alcohol  insulin  other medicines used to help people quit smoking  theophylline  warfarin This list may not describe all possible interactions. Give your health care provider a list of all the medicines, herbs, non-prescription drugs, or dietary supplements you use. Also tell them if you smoke, drink alcohol, or use illegal drugs. Some items may interact with your medicine. What should I watch for while using this medicine? It is okay if you do not succeed at your attempt to quit and have a cigarette. You can still continue your quit attempt and keep using this medicine as directed.  Just throw away your cigarettes and get back to your quit plan. Talk to your health care provider before using other treatments to quit smoking. Using this medicine with other treatments to quit smoking may increase the risk for side effects compared to using a treatment alone. You may get drowsy or dizzy. Do not drive, use machinery, or do anything that needs mental alertness until you know how this medicine affects you. Do not stand or sit up quickly, especially if you are  an older patient. This reduces the risk of dizzy or fainting spells. Decrease the number of alcoholic beverages that you drink during treatment with this medicine until you know if this medicine affects your ability to tolerate alcohol. Some people have experienced increased drunkenness (intoxication), unusual or sometimes aggressive behavior, or no memory of things that have happened (amnesia) during treatment with this medicine. Sleepwalking can happen during treatment with this medicine, and can sometimes lead to behavior that is harmful to you, other people, or property. Stop taking this medicine and tell your doctor if you start sleepwalking or have other unusual sleep-related activity. After taking this medicine, you may get up out of bed and do an activity that you do not know you are doing. The next morning, you may have no memory of this. Activities include driving a car ("sleep-driving"), making and eating food, talking on the phone, sexual activity, and sleep-walking. Serious injuries have occurred. Stop the medicine and call your doctor right away if you find out you have done any of these activities. Do not take this medicine if you have used alcohol that evening. Do not take it if you have taken another medicine for sleep. The risk of doing these sleep-related activities is higher. Patients and their families should watch out for new or worsening depression or thoughts of suicide. Also watch out for sudden changes in feelings such as feeling anxious, agitated, panicky, irritable, hostile, aggressive, impulsive, severely restless, overly excited and hyperactive, or not being able to sleep. If this happens, call your health care professional. If you have diabetes and you quit smoking, the effects of insulin may be increased and you may need to reduce your insulin dose. Check with your doctor or health care professional about how you should adjust your insulin dose. What side effects may I notice  from receiving this medicine? Side effects that you should report to your doctor or health care professional as soon as possible:  allergic reactions like skin rash, itching or hives, swelling of the face, lips, tongue, or throat  acting aggressive, being angry or violent, or acting on dangerous impulses  breathing problems  changes in emotions or moods  chest pain or chest tightness  feeling faint or lightheaded, falls  hallucination, loss of contact with reality  mouth sores  redness, blistering, peeling or loosening of the skin, including inside the mouth  signs and symptoms of a stroke like changes in vision; confusion; trouble speaking or understanding; severe headaches; sudden numbness or weakness of the face, arm or leg; trouble walking; dizziness; loss of balance or coordination  seizures  sleepwalking  suicidal thoughts or other mood changes Side effects that usually do not require medical attention (report to your doctor or health care professional if they continue or are bothersome):  constipation  gas  headache  nausea, vomiting  strange dreams  trouble sleeping This list may not describe all possible side effects. Call your doctor for medical advice about side effects. You may report side  effects to FDA at 1-800-FDA-1088. Where should I keep my medicine? Keep out of the reach of children. Store at room temperature between 15 and 30 degrees C (59 and 86 degrees F). Throw away any unused medicine after the expiration date. NOTE: This sheet is a summary. It may not cover all possible information. If you have questions about this medicine, talk to your doctor, pharmacist, or health care provider.  2020 Elsevier/Gold Standard (2018-04-03 14:27:36)
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
## 2020-03-30 ENCOUNTER — Other Ambulatory Visit: Payer: Self-pay

## 2020-03-30 ENCOUNTER — Ambulatory Visit: Payer: 59 | Admitting: Licensed Clinical Social Worker

## 2020-03-30 ENCOUNTER — Ambulatory Visit
Admission: RE | Admit: 2020-03-30 | Discharge: 2020-03-30 | Disposition: A | Discharge status: Home / Self Care | Payer: 59 | Source: Ambulatory Visit | Attending: Radiation Oncology | Admitting: Radiation Oncology

## 2020-03-30 ENCOUNTER — Encounter: Payer: Self-pay | Admitting: Radiation Oncology

## 2020-03-30 DIAGNOSIS — Z923 Personal history of irradiation: Secondary | ICD-10-CM | POA: Diagnosis not present

## 2020-03-30 DIAGNOSIS — Z171 Estrogen receptor negative status [ER-]: Secondary | ICD-10-CM | POA: Insufficient documentation

## 2020-03-30 DIAGNOSIS — C50812 Malignant neoplasm of overlapping sites of left female breast: Secondary | ICD-10-CM

## 2020-03-30 NOTE — Progress Notes (Signed)
Radiation Oncology Follow up Note  Name: [name]   Date:   03/30/2020 MRN:  [id number] DOB: 1970/12/12    This 49 y.o. 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  REFERRING PROVIDER: GSharyne Peach MD  HPI: Patient is a 49year old female now out 5 months having completed whole breast radiation to her left breast for stage I ER/PR positive HER-2/neu overexpressed invasive mammary carcinoma seen today in routine follow-up she is doing well.  She specifically denies breast tenderness cough or bone pain.  She is currently on Herceptin therapy..  She had mammograms last month which I have reviewed were BI-RADS 2 benign.  COMPLICATIONS OF TREATMENT: none  FOLLOW UP COMPLIANCE: keeps appointments   PHYSICAL EXAM:  BP (P) 100/80 (BP Location: Left Arm, Patient Position: Sitting)   Pulse (P) 92   Temp (!) (P) 96.4 F (35.8 C) (Tympanic)   Resp (P) 16   Wt (P) 122 lb 12.8 oz (55.7 kg)   LMP 02/19/2016   BMI (P) 21.08 kg/m  Lungs are clear to A&P cardiac examination essentially unremarkable with regular rate and rhythm. No dominant mass or nodularity is noted in either breast in 2 positions examined. Incision is well-healed. No axillary or supraclavicular adenopathy is appreciated. Cosmetic result is excellent.  Well-developed well-nourished patient in NAD. HEENT reveals PERLA, EOMI, discs not visualized.  Oral cavity is clear. No oral mucosal lesions are identified. Neck is clear without evidence of cervical or supraclavicular adenopathy. Lungs are clear to A&P. Cardiac examination is essentially unremarkable with regular rate and rhythm without murmur rub or thrill. Abdomen is benign with no organomegaly or masses noted. Motor sensory and DTR levels are equal and symmetric in the upper and lower extremities. Cranial nerves II through XII are grossly  intact. Proprioception is intact. No peripheral adenopathy or edema is identified. No motor or sensory levels are noted. Crude visual fields are within normal range.  RADIOLOGY RESULTS: Mammograms reviewed compatible with above-stated findings  PLAN: Present time patient is doing well she continues on treatment with Herceptin and Perjeta.  I have asked to see her back in 6 months for follow-up.  Patient knows to call with any concerns at any time.  I would like to take this opportunity to thank you for allowing me to participate in the care of your patient..Noreene Filbert MD

## 2020-04-04 ENCOUNTER — Inpatient Hospital Stay: Payer: 59

## 2020-04-04 ENCOUNTER — Other Ambulatory Visit: Payer: Self-pay

## 2020-04-04 ENCOUNTER — Inpatient Hospital Stay (HOSPITAL_BASED_OUTPATIENT_CLINIC_OR_DEPARTMENT_OTHER): Payer: 59 | Admitting: Internal Medicine

## 2020-04-04 ENCOUNTER — Inpatient Hospital Stay: Payer: 59 | Attending: Internal Medicine

## 2020-04-04 ENCOUNTER — Encounter: Payer: Self-pay | Admitting: Internal Medicine

## 2020-04-04 VITALS — BP 120/81 | HR 87 | Temp 97.6°F | Resp 18 | Wt 123.4 lb

## 2020-04-04 DIAGNOSIS — F17210 Nicotine dependence, cigarettes, uncomplicated: Secondary | ICD-10-CM | POA: Diagnosis not present

## 2020-04-04 DIAGNOSIS — C50812 Malignant neoplasm of overlapping sites of left female breast: Secondary | ICD-10-CM | POA: Insufficient documentation

## 2020-04-04 DIAGNOSIS — Z171 Estrogen receptor negative status [ER-]: Secondary | ICD-10-CM | POA: Diagnosis not present

## 2020-04-04 DIAGNOSIS — Z79899 Other long term (current) drug therapy: Secondary | ICD-10-CM | POA: Diagnosis not present

## 2020-04-04 DIAGNOSIS — C50912 Malignant neoplasm of unspecified site of left female breast: Secondary | ICD-10-CM | POA: Diagnosis not present

## 2020-04-04 DIAGNOSIS — Z5112 Encounter for antineoplastic immunotherapy: Secondary | ICD-10-CM | POA: Diagnosis present

## 2020-04-04 LAB — CBC WITH DIFFERENTIAL/PLATELET
Abs Immature Granulocytes: 0.01 10*3/uL (ref 0.00–0.07)
Basophils Absolute: 0 10*3/uL (ref 0.0–0.1)
Basophils Relative: 1 %
Eosinophils Absolute: 0 10*3/uL (ref 0.0–0.5)
Eosinophils Relative: 0 %
HCT: 39.5 % (ref 36.0–46.0)
Hemoglobin: 13.6 g/dL (ref 12.0–15.0)
Immature Granulocytes: 0 %
Lymphocytes Relative: 29 %
Lymphs Abs: 2.4 10*3/uL (ref 0.7–4.0)
MCH: 34.7 pg — ABNORMAL HIGH (ref 26.0–34.0)
MCHC: 34.4 g/dL (ref 30.0–36.0)
MCV: 100.8 fL — ABNORMAL HIGH (ref 80.0–100.0)
Monocytes Absolute: 0.7 10*3/uL (ref 0.1–1.0)
Monocytes Relative: 9 %
Neutro Abs: 5.1 10*3/uL (ref 1.7–7.7)
Neutrophils Relative %: 61 %
Platelets: 333 10*3/uL (ref 150–400)
RBC: 3.92 MIL/uL (ref 3.87–5.11)
RDW: 13.2 % (ref 11.5–15.5)
WBC: 8.3 10*3/uL (ref 4.0–10.5)
nRBC: 0 % (ref 0.0–0.2)

## 2020-04-04 LAB — COMPREHENSIVE METABOLIC PANEL
ALT: 11 U/L (ref 0–44)
AST: 20 U/L (ref 15–41)
Albumin: 3.8 g/dL (ref 3.5–5.0)
Alkaline Phosphatase: 73 U/L (ref 38–126)
Anion gap: 13 (ref 5–15)
BUN: 15 mg/dL (ref 6–20)
CO2: 22 mmol/L (ref 22–32)
Calcium: 8.9 mg/dL (ref 8.9–10.3)
Chloride: 101 mmol/L (ref 98–111)
Creatinine, Ser: 0.66 mg/dL (ref 0.44–1.00)
GFR, Estimated: >60 mL/min (ref >60)
Glucose, Bld: 122 mg/dL — ABNORMAL HIGH (ref 70–99)
Potassium: 3.5 mmol/L (ref 3.5–5.1)
Sodium: 136 mmol/L (ref 135–145)
Total Bilirubin: 0.6 mg/dL (ref 0.3–1.2)
Total Protein: 6.7 g/dL (ref 6.5–8.1)

## 2020-04-04 MED ORDER — HEPARIN SOD (PORK) LOCK FLUSH 100 UNIT/ML IV SOLN
500.0000 [IU] | Freq: Once | INTRAVENOUS | Status: AC
  Administered 2020-04-04: 500 [IU] via INTRAVENOUS
  Filled 2020-04-04: qty 5

## 2020-04-04 MED ORDER — TRASTUZUMAB-ANNS CHEMO 150 MG IV SOLR
340.0000 mg | Freq: Once | INTRAVENOUS | Status: AC
  Administered 2020-04-04: 340 mg via INTRAVENOUS
  Filled 2020-04-04: qty 16.19

## 2020-04-04 MED ORDER — DIPHENHYDRAMINE HCL 25 MG PO CAPS
50.0000 mg | ORAL_CAPSULE | Freq: Once | ORAL | Status: AC
  Administered 2020-04-04: 50 mg via ORAL
  Filled 2020-04-04: qty 2

## 2020-04-04 MED ORDER — ACETAMINOPHEN 325 MG PO TABS
650.0000 mg | ORAL_TABLET | Freq: Once | ORAL | Status: AC
  Administered 2020-04-04: 650 mg via ORAL
  Filled 2020-04-04: qty 2

## 2020-04-04 MED ORDER — HEPARIN SOD (PORK) LOCK FLUSH 100 UNIT/ML IV SOLN
INTRAVENOUS | Status: AC
  Filled 2020-04-04: qty 5

## 2020-04-04 MED ORDER — SODIUM CHLORIDE 0.9 % IV SOLN
420.0000 mg | Freq: Once | INTRAVENOUS | Status: AC
  Administered 2020-04-04: 420 mg via INTRAVENOUS
  Filled 2020-04-04: qty 14

## 2020-04-04 MED ORDER — SODIUM CHLORIDE 0.9% FLUSH
10.0000 mL | INTRAVENOUS | Status: DC | PRN
  Administered 2020-04-04: 10 mL via INTRAVENOUS
  Filled 2020-04-04: qty 10

## 2020-04-04 MED ORDER — SODIUM CHLORIDE 0.9 % IV SOLN
Freq: Once | INTRAVENOUS | Status: AC
  Filled 2020-04-04: qty 250

## 2020-04-04 NOTE — Progress Notes (Signed)
Pt reports has not started chantix yet, plans to pick up from pharmacy today.  Pt states she has a tickle in the back of her throat every day when goes to bed which causes her to cough.
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
## 2020-04-04 NOTE — Assessment & Plan Note (Addendum)
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 On mainatence Herceptin-Perjeta.  S/p radiation On mainatence Herceptin-Perjeta. STABLE.   # Proceed with herceptin-Perjeta- plan 17th today/18th-last]   Labs today reviewed;  acceptable for treatment today. MUGA  TMA'26- 54%; may  2021- 52%; STABLE; plan MUGA scan prior to next cycle.   # PN-[clinical trial]-grade-2; STABLE.? Foot drop [poor tol to gabapentin]; s/p accu puncture x 2.  EMG with Dr.Shah [dec 15th].   # Depression/Anxiety/insomnia/chemo brain-  on  Remeron to 30 mg qhs/ Effexo.  STABLE>   # DISPOSITION: # treatment today # follow up in 3 weeks; X- MD;  labs-cbc/cmp Perjeta-Herceptin; MUGA scan priro- --Dr.B

## 2020-04-18 ENCOUNTER — Other Ambulatory Visit: Payer: Self-pay

## 2020-04-18 ENCOUNTER — Encounter
Admission: RE | Admit: 2020-04-18 | Discharge: 2020-04-18 | Disposition: A | Discharge status: Home / Self Care | Payer: 59 | Source: Ambulatory Visit | Attending: Internal Medicine | Admitting: Internal Medicine

## 2020-04-18 DIAGNOSIS — C50912 Malignant neoplasm of unspecified site of left female breast: Secondary | ICD-10-CM | POA: Diagnosis present

## 2020-04-18 DIAGNOSIS — Z171 Estrogen receptor negative status [ER-]: Secondary | ICD-10-CM | POA: Diagnosis present

## 2020-04-18 DIAGNOSIS — C50812 Malignant neoplasm of overlapping sites of left female breast: Secondary | ICD-10-CM | POA: Diagnosis present

## 2020-04-18 MED ORDER — TECHNETIUM TC 99M-LABELED RED BLOOD CELLS IV KIT
20.0000 | PACK | Freq: Once | INTRAVENOUS | Status: AC | PRN
  Administered 2020-04-18: 11:00:00 20.601 via INTRAVENOUS

## 2020-04-21 ENCOUNTER — Telehealth: Payer: 59 | Admitting: Psychiatry

## 2020-04-24 NOTE — Progress Notes (Signed)
1450-Contacted patient. Left vm patient for prescreening prior to her apt tomorrow with Dr. Cathie Hoops

## 2020-04-25 ENCOUNTER — Inpatient Hospital Stay: Payer: 59

## 2020-04-25 ENCOUNTER — Encounter: Payer: Self-pay | Admitting: Oncology

## 2020-04-25 ENCOUNTER — Inpatient Hospital Stay (HOSPITAL_BASED_OUTPATIENT_CLINIC_OR_DEPARTMENT_OTHER): Payer: 59 | Admitting: Oncology

## 2020-04-25 VITALS — BP 108/78 | HR 79 | Temp 96.8°F | Resp 18 | Wt 125.5 lb

## 2020-04-25 VITALS — BP 112/77 | HR 79 | Temp 97.0°F | Resp 18

## 2020-04-25 DIAGNOSIS — Z5181 Encounter for therapeutic drug level monitoring: Secondary | ICD-10-CM | POA: Diagnosis not present

## 2020-04-25 DIAGNOSIS — Z5112 Encounter for antineoplastic immunotherapy: Secondary | ICD-10-CM | POA: Diagnosis not present

## 2020-04-25 DIAGNOSIS — F32A Depression, unspecified: Secondary | ICD-10-CM | POA: Diagnosis not present

## 2020-04-25 DIAGNOSIS — C50812 Malignant neoplasm of overlapping sites of left female breast: Secondary | ICD-10-CM

## 2020-04-25 DIAGNOSIS — Z171 Estrogen receptor negative status [ER-]: Secondary | ICD-10-CM

## 2020-04-25 DIAGNOSIS — Z79899 Other long term (current) drug therapy: Secondary | ICD-10-CM

## 2020-04-25 LAB — CBC WITH DIFFERENTIAL/PLATELET
Abs Immature Granulocytes: 0.02 10*3/uL (ref 0.00–0.07)
Basophils Absolute: 0.1 10*3/uL (ref 0.0–0.1)
Basophils Relative: 1 %
Eosinophils Absolute: 0.1 10*3/uL (ref 0.0–0.5)
Eosinophils Relative: 1 %
HCT: 39.4 % (ref 36.0–46.0)
Hemoglobin: 13.5 g/dL (ref 12.0–15.0)
Immature Granulocytes: 0 %
Lymphocytes Relative: 34 %
Lymphs Abs: 3 10*3/uL (ref 0.7–4.0)
MCH: 34.9 pg — ABNORMAL HIGH (ref 26.0–34.0)
MCHC: 34.3 g/dL (ref 30.0–36.0)
MCV: 101.8 fL — ABNORMAL HIGH (ref 80.0–100.0)
Monocytes Absolute: 0.9 10*3/uL (ref 0.1–1.0)
Monocytes Relative: 10 %
Neutro Abs: 4.8 10*3/uL (ref 1.7–7.7)
Neutrophils Relative %: 54 %
Platelets: 315 10*3/uL (ref 150–400)
RBC: 3.87 MIL/uL (ref 3.87–5.11)
RDW: 13.2 % (ref 11.5–15.5)
WBC: 8.8 10*3/uL (ref 4.0–10.5)
nRBC: 0 % (ref 0.0–0.2)

## 2020-04-25 LAB — COMPREHENSIVE METABOLIC PANEL
ALT: 12 U/L (ref 0–44)
AST: 18 U/L (ref 15–41)
Albumin: 3.7 g/dL (ref 3.5–5.0)
Alkaline Phosphatase: 67 U/L (ref 38–126)
Anion gap: 11 (ref 5–15)
BUN: 9 mg/dL (ref 6–20)
CO2: 23 mmol/L (ref 22–32)
Calcium: 8.8 mg/dL — ABNORMAL LOW (ref 8.9–10.3)
Chloride: 102 mmol/L (ref 98–111)
Creatinine, Ser: 0.46 mg/dL (ref 0.44–1.00)
GFR, Estimated: >60 mL/min (ref >60)
Glucose, Bld: 106 mg/dL — ABNORMAL HIGH (ref 70–99)
Potassium: 3.4 mmol/L — ABNORMAL LOW (ref 3.5–5.1)
Sodium: 136 mmol/L (ref 135–145)
Total Bilirubin: 0.5 mg/dL (ref 0.3–1.2)
Total Protein: 6.6 g/dL (ref 6.5–8.1)

## 2020-04-25 MED ORDER — SODIUM CHLORIDE 0.9% FLUSH
10.0000 mL | Freq: Once | INTRAVENOUS | Status: AC
  Administered 2020-04-25: 09:00:00 10 mL via INTRAVENOUS
  Filled 2020-04-25: qty 10

## 2020-04-25 MED ORDER — SODIUM CHLORIDE 0.9 % IV SOLN
Freq: Once | INTRAVENOUS | Status: AC
  Filled 2020-04-25: qty 250

## 2020-04-25 MED ORDER — ACETAMINOPHEN 325 MG PO TABS
650.0000 mg | ORAL_TABLET | Freq: Once | ORAL | Status: AC
  Administered 2020-04-25: 09:00:00 650 mg via ORAL
  Filled 2020-04-25: qty 2

## 2020-04-25 MED ORDER — SODIUM CHLORIDE 0.9 % IV SOLN
420.0000 mg | Freq: Once | INTRAVENOUS | Status: AC
  Administered 2020-04-25: 11:00:00 420 mg via INTRAVENOUS
  Filled 2020-04-25: qty 14

## 2020-04-25 MED ORDER — HEPARIN SOD (PORK) LOCK FLUSH 100 UNIT/ML IV SOLN
500.0000 [IU] | Freq: Once | INTRAVENOUS | Status: AC | PRN
  Administered 2020-04-25: 11:00:00 500 [IU]
  Filled 2020-04-25: qty 5

## 2020-04-25 MED ORDER — DIPHENHYDRAMINE HCL 25 MG PO CAPS
50.0000 mg | ORAL_CAPSULE | Freq: Once | ORAL | Status: AC
  Administered 2020-04-25: 09:00:00 50 mg via ORAL
  Filled 2020-04-25: qty 2

## 2020-04-25 MED ORDER — TRASTUZUMAB-ANNS CHEMO 150 MG IV SOLR
340.0000 mg | Freq: Once | INTRAVENOUS | Status: AC
  Administered 2020-04-25: 10:00:00 340 mg via INTRAVENOUS
  Filled 2020-04-25: qty 16.19
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
## 2020-04-25 NOTE — Progress Notes (Signed)
Patient here for follow up. No new concerns voiced.  °

## 2020-04-25 NOTE — Progress Notes (Signed)
Pt tolerated infusion well. Pt declines 30 minute observation period. No s/s of distress or reaction noted. Pt and VS stable at discharge.

## 2020-05-01 ENCOUNTER — Other Ambulatory Visit: Payer: Self-pay

## 2020-05-01 ENCOUNTER — Ambulatory Visit (INDEPENDENT_AMBULATORY_CARE_PROVIDER_SITE_OTHER): Payer: 59 | Admitting: Licensed Clinical Social Worker

## 2020-05-01 DIAGNOSIS — F321 Major depressive disorder, single episode, moderate: Secondary | ICD-10-CM | POA: Diagnosis not present

## 2020-05-01 DIAGNOSIS — Z634 Disappearance and death of family member: Secondary | ICD-10-CM | POA: Diagnosis not present

## 2020-05-01 DIAGNOSIS — F411 Generalized anxiety disorder: Secondary | ICD-10-CM | POA: Diagnosis not present

## 2020-05-01 NOTE — Progress Notes (Signed)
Virtual Visit via Video Note  I connected with [name] on 05/01/20 at  1:00 PM EST by a video enabled telemedicine application and verified that I am speaking with the correct person using two identifiers.  Location: Patient: home Provider: [facility]   I discussed the limitations of evaluation and management by telemedicine and the availability of in person appointments. The patient expressed understanding and agreed to proceed.  The patient was advised to call back or seek an in-person evaluation if the symptoms worsen or if the condition fails to improve as anticipated.  I provided 45 minutes of non-face-to-face time during this encounter.   Aquil Duhe R Veida Spira, LCSW    THERAPIST PROGRESS NOTE  Session Time: 1-1:45p  Participation Level: Active  Behavioral Response: Neat and Well GroomedAlertAnxious  Type of Therapy: Individual Therapy  Treatment Goals addressed: Anxiety and Coping  Interventions: Supportive  Summary: [name] is a 50 y.o. female who presents with improving symptoms related to depression and anxiety diagnoses.  Pt reports situational mood fluctuations but stable for most waking hours of day. Pt reports good quality and quantity of sleep.  Allowed pt to explore and express thoughts and feelings about family relationships and grief (mother, father). Pt particularly missing mother around the holiday. Discussion triggered feelings that patient has about covid and overall psychological impact of covid/pandemic in general. Allowed pt safe space to express and process through current grief.   Discussed pts current cancer treatment and how it is impacting physical and emotional well being. Pt is very positive and states that it only fuels her drive and passion for life more. Discussed pros and cons of treatments and ways that pt has tolerated the distress. Reviewed coping mechanisms.   Pt reports that she is trying to get out and engage more with  peers/socially. Pt admits that this makes her feel better about herself when she is with others. Planning trips to [place] to surf in the new year.   Suicidal/Homicidal: No  Therapist Response: Dejah demonstrates a growing pleasure in family relationship, greater enjoyment of leisure/recreational activities, is tolerating distress well and managing anxiety symptoms. Treatment reflecting good evolution and development  Plan: Return again in 4 weeks.  Diagnosis: Axis I: Major depressive disorder, recurrent, moderate; Generalized anxiety disorder, Bereavement    Axis II: No diagnosis    Ernest Haber Westin Knotts, LCSW 05/01/2020

## 2020-05-05 ENCOUNTER — Encounter: Payer: Self-pay | Admitting: *Deleted

## 2020-05-05 ENCOUNTER — Telehealth: Payer: Self-pay | Admitting: Internal Medicine

## 2020-05-05 NOTE — Telephone Encounter (Signed)
Pt called in regards to next step in treatment. Message sent to team to return call to advise.

## 2020-05-05 NOTE — Telephone Encounter (Signed)
Dr. Rogue Bussing will determine the follow-up when MD returns on 05/08/20

## 2020-05-08 ENCOUNTER — Telehealth: Payer: Self-pay | Admitting: *Deleted

## 2020-05-08 DIAGNOSIS — G5603 Carpal tunnel syndrome, bilateral upper limbs: Secondary | ICD-10-CM | POA: Insufficient documentation

## 2020-05-08 NOTE — Telephone Encounter (Signed)
See RN phone note from 05/08/20

## 2020-05-08 NOTE — Telephone Encounter (Signed)
Spoke with Dr. Rogue Bussing. MD would like patient to arrange for follow-up in 3 months with labs. Patient contacted. apts set up.  She would like to know if she can have her port a cath removed. Dr. Jacinto Reap- please advise.

## 2020-05-10 NOTE — Telephone Encounter (Signed)
Per Dr. Jacinto Reap- hold off on port removal at this time. Patient aware. MD will discuss port removal at next visit in April

## 2020-05-16 ENCOUNTER — Other Ambulatory Visit: Payer: Self-pay

## 2020-05-16 ENCOUNTER — Telehealth (INDEPENDENT_AMBULATORY_CARE_PROVIDER_SITE_OTHER): Payer: 59 | Admitting: Psychiatry

## 2020-05-16 ENCOUNTER — Encounter: Payer: Self-pay | Admitting: Psychiatry

## 2020-05-16 DIAGNOSIS — F17200 Nicotine dependence, unspecified, uncomplicated: Secondary | ICD-10-CM

## 2020-05-16 DIAGNOSIS — F411 Generalized anxiety disorder: Secondary | ICD-10-CM

## 2020-05-16 DIAGNOSIS — F3342 Major depressive disorder, recurrent, in full remission: Secondary | ICD-10-CM | POA: Diagnosis not present

## 2020-05-16 DIAGNOSIS — Z634 Disappearance and death of family member: Secondary | ICD-10-CM | POA: Diagnosis not present

## 2020-05-16 MED ORDER — MIRTAZAPINE 15 MG PO TABS: 15.0000 mg | ORAL_TABLET | Freq: Every day | ORAL | 1 refills | Status: DC

## 2020-05-16 MED ORDER — VARENICLINE TARTRATE 0.5 MG PO TABS: 0.5000 mg | ORAL_TABLET | Freq: Two times a day (BID) | ORAL | 1 refills | Status: DC

## 2020-05-16 MED ORDER — VENLAFAXINE HCL ER 75 MG PO CP24: 75.0000 mg | ORAL_CAPSULE | Freq: Every day | ORAL | 1 refills | Status: DC

## 2020-05-16 MED ORDER — TRAZODONE HCL 50 MG PO TABS: 50.0000 mg | ORAL_TABLET | Freq: Every evening | ORAL | 1 refills | Status: DC | PRN
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
## 2020-06-20 ENCOUNTER — Telehealth: Payer: Self-pay | Admitting: Psychiatry

## 2020-06-20 DIAGNOSIS — F17200 Nicotine dependence, unspecified, uncomplicated: Secondary | ICD-10-CM

## 2020-06-20 MED ORDER — VARENICLINE TARTRATE 1 MG PO TABS: 1.0000 mg | ORAL_TABLET | Freq: Every day | ORAL | 2 refills | Status: DC

## 2020-06-20 NOTE — Telephone Encounter (Signed)
Will send Chantix 1 mg daily to pharmacy since Chantix 0.5 mg is on back order.

## 2020-06-26 ENCOUNTER — Encounter: Payer: Self-pay | Admitting: Psychiatry

## 2020-06-26 ENCOUNTER — Other Ambulatory Visit: Payer: Self-pay

## 2020-06-26 ENCOUNTER — Telehealth (INDEPENDENT_AMBULATORY_CARE_PROVIDER_SITE_OTHER): Payer: 59 | Admitting: Psychiatry

## 2020-06-26 DIAGNOSIS — F411 Generalized anxiety disorder: Secondary | ICD-10-CM

## 2020-06-26 DIAGNOSIS — F3342 Major depressive disorder, recurrent, in full remission: Secondary | ICD-10-CM | POA: Insufficient documentation

## 2020-06-26 DIAGNOSIS — Z634 Disappearance and death of family member: Secondary | ICD-10-CM | POA: Diagnosis not present

## 2020-06-26 DIAGNOSIS — F17200 Nicotine dependence, unspecified, uncomplicated: Secondary | ICD-10-CM
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
## 2020-06-27 ENCOUNTER — Ambulatory Visit (INDEPENDENT_AMBULATORY_CARE_PROVIDER_SITE_OTHER): Payer: Self-pay | Admitting: Licensed Clinical Social Worker

## 2020-06-27 DIAGNOSIS — Z5329 Procedure and treatment not carried out because of patient's decision for other reasons: Secondary | ICD-10-CM

## 2020-06-27 NOTE — Progress Notes (Signed)
LCSW counselor tried to connect with patient for scheduled appointment via MyChart video text request x 2 and email request; also tried to connect via phone without success. LCSW counselor left message for patient to call office number to reschedule OPT appointment.

## 2020-06-29 ENCOUNTER — Ambulatory Visit (INDEPENDENT_AMBULATORY_CARE_PROVIDER_SITE_OTHER): Payer: 59 | Admitting: Licensed Clinical Social Worker

## 2020-06-29 ENCOUNTER — Other Ambulatory Visit: Payer: Self-pay

## 2020-06-29 DIAGNOSIS — F411 Generalized anxiety disorder: Secondary | ICD-10-CM

## 2020-06-29 DIAGNOSIS — F3342 Major depressive disorder, recurrent, in full remission: Secondary | ICD-10-CM

## 2020-06-29 NOTE — Progress Notes (Signed)
Virtual Visit via Video Note  I connected with [name] on 06/29/20 at  1:00 PM EST by a video enabled telemedicine application and verified that I am speaking with the correct person using two identifiers.  Location: Patient: home Provider: [facility]   I discussed the limitations of evaluation and management by telemedicine and the availability of in person appointments. The patient expressed understanding and agreed to proceed.  I discussed the assessment and treatment plan with the patient. The patient was provided an opportunity to ask questions and all were answered. The patient agreed with the plan and demonstrated an understanding of the instructions.   The patient was advised to call back or seek an in-person evaluation if the symptoms worsen or if the condition fails to improve as anticipated.  I provided 30 minutes of non-face-to-face time during this encounter.   [name] R Neeraj Housand, LCSW    THERAPIST PROGRESS NOTE  Session Time: 1-1:30a  Participation Level: Active  Behavioral Response: NeatAlertAnxious  Type of Therapy: Individual Therapy  Treatment Goals addressed: Anxiety and Coping  Interventions: Supportive, Reframing and Other: setting limits  Summary: [name] is a 50 y.o. female who presents with improving depression and anxiety symptoms. Pt reports that overall mood has improved, especially in the last few days. Pt reports that she feels she is being more active and getting outside more, and the nice weather is also contributing helping manage symptoms.   Allowed pt safe space to discuss current stressors and uncertainties: pt is worried about state of current world. Discussed amount of time that pt is focused on news/media and possibly setting limits.   Continued recommendations are as follows: self care behaviors, positive social engagements, focusing on overall work/home/life balance, and focusing on positive physical and emotional wellness.    Suicidal/Homicidal: No  Therapist Response: [name] is able to implement appropriate relaxation and diversion activities to decrease levels of anxiety. [name] is able to re-engage with friends and family members in a positive way. [name] is able to identify depression and anxiety triggers and is able to articulate mitigating factors and implement regularly to manage symptoms. Pt reporting a decrease in symptoms, especially in the past few days. This is reflective of personal growth and progress. Treatment to continue as indicated.   Plan: Return again in 5 weeks.  Diagnosis: Axis I: Generalized Anxiety Disorder and Major Depression, Recurrent severe    Axis II: No diagnosis    [place], LCSW 06/29/2020

## 2020-07-27 ENCOUNTER — Other Ambulatory Visit: Payer: Self-pay | Admitting: Psychiatry

## 2020-07-27 DIAGNOSIS — Z634 Disappearance and death of family member: Secondary | ICD-10-CM

## 2020-07-27 DIAGNOSIS — F411 Generalized anxiety disorder: Secondary | ICD-10-CM

## 2020-08-02 ENCOUNTER — Encounter: Payer: Self-pay | Admitting: Internal Medicine

## 2020-08-02 ENCOUNTER — Other Ambulatory Visit: Payer: Self-pay | Admitting: *Deleted

## 2020-08-02 DIAGNOSIS — Z13220 Encounter for screening for lipoid disorders: Secondary | ICD-10-CM

## 2020-08-02 DIAGNOSIS — R5383 Other fatigue: Secondary | ICD-10-CM

## 2020-08-03 ENCOUNTER — Other Ambulatory Visit: Payer: Self-pay

## 2020-08-03 ENCOUNTER — Ambulatory Visit (INDEPENDENT_AMBULATORY_CARE_PROVIDER_SITE_OTHER): Payer: 59 | Admitting: Licensed Clinical Social Worker

## 2020-08-03 DIAGNOSIS — F3342 Major depressive disorder, recurrent, in full remission: Secondary | ICD-10-CM

## 2020-08-03 DIAGNOSIS — F411 Generalized anxiety disorder: Secondary | ICD-10-CM

## 2020-08-03 NOTE — Progress Notes (Signed)
Virtual Visit via Video Note  I connected with [name] on 08/03/20 at 11:00 AM EDT by a video enabled telemedicine application and verified that I am speaking with the correct person using two identifiers.  Location: Patient: home Provider: remote office [place], [place])   I discussed the limitations of evaluation and management by telemedicine and the availability of in person appointments. The patient expressed understanding and agreed to proceed.  I discussed the assessment and treatment plan with the patient. The patient was provided an opportunity to ask questions and all were answered. The patient agreed with the plan and demonstrated an understanding of the instructions.   The patient was advised to call back or seek an in-person evaluation if the symptoms worsen or if the condition fails to improve as anticipated.  I provided 30  minutes of non-face-to-face time during this encounter.   Antoniette Peake R Halbert Jesson, LCSW    THERAPIST PROGRESS NOTE  Session Time: 11-11:30a  Participation Level: Active  Behavioral Response: NAAlertAnxious  Type of Therapy: Individual Therapy  Treatment Goals addressed: Anxiety and Coping  Interventions: Supportive  Summary: [name] is a 50 y.o. female who presents with improving symptoms related to depression. Pt reports that overall mood is stable and that she is managing stress and anxiety well.   Pt is currently in pain after a recent fall--hurt the bottom of back/buttocks area. Pt has appt to see MD on 4/11.    Pt feels that the pain makes it hard to get comfortable if she's sitting, sleeping, etc. Pt is trying hard to incorporate other positive sleep hygiene interventions to help during this time.  Pt is proud of son--he has decided that he wants to attend [employer]   Explored pts feelings of loss--still missing mother, father and upcoming Easter holiday is a trigger. Reviewed tips to help with grief/loss during holiday  seasons.  Suicidal/Homicidal: No  Therapist Response: Jude is able to implement appropriate relaxation and diversion activities to decrease levels of anxiety. Wilfred is able to re-engage with friends and family members in a positive way. Latorria is able to identify depression and anxiety triggers and is able to articulate mitigating factors and implement regularly to manage symptoms. This is reflective of personal growth and progress. Treatment to continue as indicated.   Plan: Return again in 3 weeks.  Diagnosis: Axis I: MDD, recurrent, in full remission; GAD    Axis II: No diagnosis    [place], LCSW 08/03/2020

## 2020-08-07 ENCOUNTER — Inpatient Hospital Stay: Payer: 59 | Attending: Internal Medicine | Admitting: Internal Medicine

## 2020-08-07 ENCOUNTER — Inpatient Hospital Stay: Payer: 59

## 2020-08-07 ENCOUNTER — Other Ambulatory Visit: Payer: Self-pay | Admitting: *Deleted

## 2020-08-07 ENCOUNTER — Other Ambulatory Visit: Payer: Self-pay

## 2020-08-07 ENCOUNTER — Encounter: Payer: Self-pay | Admitting: Internal Medicine

## 2020-08-07 VITALS — BP 114/68 | HR 101 | Temp 97.8°F | Resp 20 | Wt 129.0 lb

## 2020-08-07 DIAGNOSIS — Z13220 Encounter for screening for lipoid disorders: Secondary | ICD-10-CM

## 2020-08-07 DIAGNOSIS — Z95828 Presence of other vascular implants and grafts: Secondary | ICD-10-CM

## 2020-08-07 DIAGNOSIS — C50812 Malignant neoplasm of overlapping sites of left female breast: Secondary | ICD-10-CM | POA: Insufficient documentation

## 2020-08-07 DIAGNOSIS — Z171 Estrogen receptor negative status [ER-]: Secondary | ICD-10-CM | POA: Diagnosis not present

## 2020-08-07 DIAGNOSIS — R5383 Other fatigue: Secondary | ICD-10-CM

## 2020-08-07 DIAGNOSIS — F17210 Nicotine dependence, cigarettes, uncomplicated: Secondary | ICD-10-CM | POA: Insufficient documentation

## 2020-08-07 LAB — CBC WITH DIFFERENTIAL/PLATELET
Abs Immature Granulocytes: 0.04 10*3/uL (ref 0.00–0.07)
Basophils Absolute: 0.1 10*3/uL (ref 0.0–0.1)
Basophils Relative: 1 %
Eosinophils Absolute: 0.1 10*3/uL (ref 0.0–0.5)
Eosinophils Relative: 1 %
HCT: 38.3 % (ref 36.0–46.0)
Hemoglobin: 13.1 g/dL (ref 12.0–15.0)
Immature Granulocytes: 0 %
Lymphocytes Relative: 30 %
Lymphs Abs: 3.6 10*3/uL (ref 0.7–4.0)
MCH: 34.9 pg — ABNORMAL HIGH (ref 26.0–34.0)
MCHC: 34.2 g/dL (ref 30.0–36.0)
MCV: 102.1 fL — ABNORMAL HIGH (ref 80.0–100.0)
Monocytes Absolute: 1 10*3/uL (ref 0.1–1.0)
Monocytes Relative: 8 %
Neutro Abs: 7.3 10*3/uL (ref 1.7–7.7)
Neutrophils Relative %: 60 %
Platelets: 347 10*3/uL (ref 150–400)
RBC: 3.75 MIL/uL — ABNORMAL LOW (ref 3.87–5.11)
RDW: 13 % (ref 11.5–15.5)
WBC: 12 10*3/uL — ABNORMAL HIGH (ref 4.0–10.5)
nRBC: 0 % (ref 0.0–0.2)

## 2020-08-07 LAB — COMPREHENSIVE METABOLIC PANEL
ALT: 12 U/L (ref 0–44)
AST: 22 U/L (ref 15–41)
Albumin: 3.8 g/dL (ref 3.5–5.0)
Alkaline Phosphatase: 71 U/L (ref 38–126)
Anion gap: 10 (ref 5–15)
BUN: 17 mg/dL (ref 6–20)
CO2: 25 mmol/L (ref 22–32)
Calcium: 9.3 mg/dL (ref 8.9–10.3)
Chloride: 102 mmol/L (ref 98–111)
Creatinine, Ser: 0.79 mg/dL (ref 0.44–1.00)
GFR, Estimated: >60 mL/min (ref >60)
Glucose, Bld: 155 mg/dL — ABNORMAL HIGH (ref 70–99)
Potassium: 4 mmol/L (ref 3.5–5.1)
Sodium: 137 mmol/L (ref 135–145)
Total Bilirubin: 0.4 mg/dL (ref 0.3–1.2)
Total Protein: 6.7 g/dL (ref 6.5–8.1)

## 2020-08-07 LAB — LIPID PANEL
Cholesterol: 146 mg/dL (ref 0–200)
HDL: 58 mg/dL (ref >40)
LDL Cholesterol: 24 mg/dL (ref 0–99)
Total CHOL/HDL Ratio: 2.5 RATIO
Triglycerides: 320 mg/dL — ABNORMAL HIGH (ref <150)
VLDL: 64 mg/dL — ABNORMAL HIGH (ref 0–40)

## 2020-08-07 MED ORDER — HEPARIN SOD (PORK) LOCK FLUSH 100 UNIT/ML IV SOLN
INTRAVENOUS | Status: AC
  Filled 2020-08-07: qty 5

## 2020-08-07 MED ORDER — HEPARIN SOD (PORK) LOCK FLUSH 100 UNIT/ML IV SOLN
500.0000 [IU] | Freq: Once | INTRAVENOUS | Status: AC
  Administered 2020-08-07: 500 [IU] via INTRAVENOUS
  Filled 2020-08-07: qty 5

## 2020-08-07 MED ORDER — SODIUM CHLORIDE 0.9% FLUSH
10.0000 mL | Freq: Once | INTRAVENOUS | Status: AC
  Administered 2020-08-07: 10 mL via INTRAVENOUS
  Filled 2020-08-07: qty 10

## 2020-08-07 NOTE — Research (Signed)
N/s
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
## 2020-08-07 NOTE — Assessment & Plan Note (Addendum)
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  s/p mainatence Herceptin-Perjeta. STABLE.  Clinically no evidence of recurrence.  Monitor on clinical basis.  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 Foot drop [poor tol to gabapentin]; s/p accu puncture x 2.  EMG with Dr.Shah [dec 15th].   # Depression/Anxiety/insomnia/chemo brain-  on Remeron to 30 mg qhs/ Effexor-stable  #Port explantation-I think is reasonable to have the port explanted; refer to Dr. Lysle Pearl.  # DISPOSITION: # refer to Dr.Sakai re: port removal.  # follow up in 6 months- MD; labs- cbc/cmp/ca-27-29-Dr.B

## 2020-08-08 LAB — THYROID PANEL WITH TSH
Free Thyroxine Index: 1.2 (ref 1.2–4.9)
T3 Uptake Ratio: 25 % (ref 24–39)
T4, Total: 4.7 ug/dL (ref 4.5–12.0)
TSH: 1.35 u[IU]/mL (ref 0.450–4.500)

## 2020-08-14 ENCOUNTER — Other Ambulatory Visit: Payer: Self-pay

## 2020-08-14 ENCOUNTER — Telehealth (INDEPENDENT_AMBULATORY_CARE_PROVIDER_SITE_OTHER): Payer: 59 | Admitting: Psychiatry

## 2020-08-14 ENCOUNTER — Encounter: Payer: Self-pay | Admitting: Psychiatry

## 2020-08-14 DIAGNOSIS — Z634 Disappearance and death of family member: Secondary | ICD-10-CM | POA: Diagnosis not present

## 2020-08-14 DIAGNOSIS — F411 Generalized anxiety disorder: Secondary | ICD-10-CM | POA: Diagnosis not present

## 2020-08-14 DIAGNOSIS — F17200 Nicotine dependence, unspecified, uncomplicated: Secondary | ICD-10-CM

## 2020-08-14 DIAGNOSIS — F3342 Major depressive disorder, recurrent, in full remission: Secondary | ICD-10-CM | POA: Diagnosis not present

## 2020-08-14 MED ORDER — VENLAFAXINE HCL ER 75 MG PO CP24: 75.0000 mg | ORAL_CAPSULE | Freq: Every day | ORAL | 2 refills | Status: DC

## 2020-08-14 MED ORDER — VARENICLINE TARTRATE 1 MG PO TABS: 1.0000 mg | ORAL_TABLET | Freq: Every day | ORAL | 2 refills | Status: DC

## 2020-08-14 MED ORDER — HYDROXYZINE HCL 25 MG PO TABS: 25.0000 mg | ORAL_TABLET | Freq: Three times a day (TID) | ORAL | 2 refills | Status: DC | PRN
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
## 2020-08-18 ENCOUNTER — Encounter: Payer: Self-pay | Admitting: Internal Medicine

## 2020-08-18 ENCOUNTER — Other Ambulatory Visit: Payer: Self-pay

## 2020-08-18 DIAGNOSIS — Z452 Encounter for adjustment and management of vascular access device: Secondary | ICD-10-CM

## 2020-08-31 ENCOUNTER — Other Ambulatory Visit: Payer: Self-pay

## 2020-08-31 ENCOUNTER — Ambulatory Visit (INDEPENDENT_AMBULATORY_CARE_PROVIDER_SITE_OTHER): Payer: 59 | Admitting: Licensed Clinical Social Worker

## 2020-08-31 DIAGNOSIS — F3342 Major depressive disorder, recurrent, in full remission: Secondary | ICD-10-CM

## 2020-08-31 NOTE — Progress Notes (Signed)
Virtual Visit via Video Note  I connected with [name] on 08/31/20 at 11:00 AM EDT by a video enabled telemedicine application and verified that I am speaking with the correct person using two identifiers.  Location: Patient: home Provider: remote office [place], [place])   I discussed the limitations of evaluation and management by telemedicine and the availability of in person appointments. The patient expressed understanding and agreed to proceed.   I discussed the assessment and treatment plan with the patient. The patient was provided an opportunity to ask questions and all were answered. The patient agreed with the plan and demonstrated an understanding of the instructions.   The patient was advised to call back or seek an in-person evaluation if the symptoms worsen or if the condition fails to improve as anticipated.  I provided 60 minutes of non-face-to-face time during this encounter.   [place], LCSW    THERAPIST PROGRESS NOTE  Session Time: 11a-12p  Participation Level: Active  Behavioral Response: NAAlertAnxious  Type of Therapy: Individual Therapy  Treatment Goals addressed: Anxiety  Interventions: Supportive and Other: trauma focused  Summary: [name] is a 50 y.o. female who presents with improving symptoms related to depression diagnosis. Pt reports that overall mood feels stable and that she is managing stress/anxiety well.   Discussed pts experience recently getting cancer port removed--pt feels that this was a big milestone in her overall healing and battle with cancer. Pt very emotional when discussing how she wishes that her late mother was there with her to celebrate.   Allowed pt safe space to talk about her mother and process through her own personal grieving process.   Discussed anxiety over son going to college in one year--encouraged pt to think more in line with the present. Encouraged mindfulness and self  care.  Continued recommendations are as follows: self care behaviors, positive social engagements, focusing on overall work/home/life balance, and focusing on positive physical and emotional wellness.   Suicidal/Homicidal: No  Therapist Response: [place] is able to implement appropriate relaxation and diversion activities to decrease levels of anxiety. [place] is able to re-engage with friends and family members in a positive way. Yousra is able to identify depression and anxiety triggers and is able to articulate mitigating factors and implement regularly to manage symptoms. This is reflective of personal growth and progress. Treatment to continue as indicated.  Plan: Return again in 4 weeks.  Diagnosis: Axis I: MDD, recurrent, in remission    Axis II: No diagnosis    [place], LCSW 08/31/2020

## 2020-09-19 ENCOUNTER — Encounter: Payer: Self-pay | Admitting: Internal Medicine

## 2020-09-28 ENCOUNTER — Ambulatory Visit
Admission: RE | Admit: 2020-09-28 | Discharge: 2020-09-28 | Disposition: A | Discharge status: Home / Self Care | Payer: 59 | Source: Ambulatory Visit | Attending: Radiation Oncology | Admitting: Radiation Oncology

## 2020-09-28 ENCOUNTER — Encounter: Payer: Self-pay | Admitting: Radiation Oncology

## 2020-09-28 VITALS — BP 113/77 | HR 78 | Temp 98.1°F | Wt 129.0 lb

## 2020-09-28 DIAGNOSIS — Z923 Personal history of irradiation: Secondary | ICD-10-CM | POA: Diagnosis not present

## 2020-09-28 DIAGNOSIS — C50812 Malignant neoplasm of overlapping sites of left female breast: Secondary | ICD-10-CM

## 2020-09-28 DIAGNOSIS — Z171 Estrogen receptor negative status [ER-]: Secondary | ICD-10-CM

## 2020-09-28 DIAGNOSIS — Z853 Personal history of malignant neoplasm of breast: Secondary | ICD-10-CM | POA: Insufficient documentation

## 2020-09-28 NOTE — Progress Notes (Signed)
Radiation Oncology Follow up Note  Name: [name]   Date:   09/28/2020 MRN:  [id number] DOB: 10-01-1970    This 50 y.o. female presents to the clinic today for 1 year follow-up status post whole breast radiation to her left breast for ER/PR negative HER2/neu overexpressed invasive mammary carcinoma.  REFERRING PROVIDER: Sharyne Peach, MD  HPI: Patient is a 50 year old female now out 1 year having completed whole breast radiation to her left breast for ER/PR negative HER2/neu overexpressed invasive mammary carcinoma seen today in routine follow-up she is doing well she specifically denies breast tenderness cough or bone pain.  She is completed her Herceptin and is well has had her Port-A-Cath removed..  She had mammograms back in November which I have reviewed which were BI-RADS 2 benign.  She is not on antiestrogen therapy based on the ER/PR negative nature of her disease.  COMPLICATIONS OF TREATMENT: none  FOLLOW UP COMPLIANCE: keeps appointments   PHYSICAL EXAM:  BP 113/77   Pulse 78   Temp 98.1 F (36.7 C) (Tympanic)   Wt 129 lb (58.5 kg)   LMP 02/19/2016   BMI 22.14 kg/m  Lungs are clear to A&P cardiac examination essentially unremarkable with regular rate and rhythm. No dominant mass or nodularity is noted in either breast in 2 positions examined. Incision is well-healed. No axillary or supraclavicular adenopathy is appreciated. Cosmetic result is excellent.  Well-developed well-nourished patient in NAD. HEENT reveals PERLA, EOMI, discs not visualized.  Oral cavity is clear. No oral mucosal lesions are identified. Neck is clear without evidence of cervical or supraclavicular adenopathy. Lungs are clear to A&P. Cardiac examination is essentially unremarkable with regular rate and rhythm without murmur rub or thrill. Abdomen is benign with no organomegaly or masses noted. Motor sensory and DTR levels are equal and symmetric in the upper and lower extremities. Cranial nerves II  through XII are grossly intact. Proprioception is intact. No peripheral adenopathy or edema is identified. No motor or sensory levels are noted. Crude visual fields are within normal range.  RADIOLOGY RESULTS: Mammograms reviewed compatible with above-stated findings  PLAN: Present time patient is doing well with no evidence of disease 1 year out and pleased with her overall progress.  I have asked to see her back in 1 year for follow-up.  She continues close follow-up care by medical oncology.  I would like to take this opportunity to thank you for allowing me to participate in the care of your patient.Noreene Filbert, MD

## 2020-09-28 NOTE — Progress Notes (Signed)
Survivorship Care Plan visit completed.  Treatment summary reviewed and given to patient.  ASCO answers booklet reviewed and given to patient.  CARE program and Cancer Transitions discussed with patient along with other resources cancer center offers to patients and caregivers.  Patient verbalized understanding.    

## 2020-10-10 ENCOUNTER — Other Ambulatory Visit: Payer: Self-pay

## 2020-10-10 ENCOUNTER — Encounter: Payer: Self-pay | Admitting: Psychiatry

## 2020-10-10 ENCOUNTER — Ambulatory Visit (INDEPENDENT_AMBULATORY_CARE_PROVIDER_SITE_OTHER): Payer: 59 | Admitting: Psychiatry

## 2020-10-10 VITALS — BP 112/79 | HR 85 | Temp 98.1°F | Wt 132.4 lb

## 2020-10-10 DIAGNOSIS — F411 Generalized anxiety disorder: Secondary | ICD-10-CM | POA: Diagnosis not present

## 2020-10-10 DIAGNOSIS — F3342 Major depressive disorder, recurrent, in full remission: Secondary | ICD-10-CM

## 2020-10-10 DIAGNOSIS — Z634 Disappearance and death of family member: Secondary | ICD-10-CM | POA: Diagnosis not present

## 2020-10-10 DIAGNOSIS — F17200 Nicotine dependence, unspecified, uncomplicated: Secondary | ICD-10-CM

## 2020-10-10 MED ORDER — MIRTAZAPINE 15 MG PO TABS: ORAL_TABLET | ORAL | 2 refills | Status: DC
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
## 2020-10-17 ENCOUNTER — Other Ambulatory Visit: Payer: Self-pay

## 2020-10-17 ENCOUNTER — Ambulatory Visit (INDEPENDENT_AMBULATORY_CARE_PROVIDER_SITE_OTHER): Payer: 59 | Admitting: Licensed Clinical Social Worker

## 2020-10-17 DIAGNOSIS — F411 Generalized anxiety disorder: Secondary | ICD-10-CM

## 2020-10-17 DIAGNOSIS — F3342 Major depressive disorder, recurrent, in full remission: Secondary | ICD-10-CM | POA: Diagnosis not present

## 2020-10-17 NOTE — Progress Notes (Addendum)
Virtual Visit via Video Note  I connected with [name] on 10/17/20 at 11:00 AM EDT by a video enabled telemedicine application and verified that I am speaking with the correct person using two identifiers.  Location: Patient: home Provider: remote office [place], [place])   I discussed the limitations of evaluation and management by telemedicine and the availability of in person appointments. The patient expressed understanding and agreed to proceed.   I discussed the assessment and treatment plan with the patient. The patient was provided an opportunity to ask questions and all were answered. The patient agreed with the plan and demonstrated an understanding of the instructions.   The patient was advised to call back or seek an in-person evaluation if the symptoms worsen or if the condition fails to improve as anticipated.  I provided 30 minutes of non-face-to-face time during this encounter.   Lavaughn Haberle R Jaunita Mikels, LCSW   THERAPIST PROGRESS NOTE  Session Time: 11-11:30a  Participation Level: Active  Behavioral Response: NAAlertEuthymic  Type of Therapy: Individual Therapy  Treatment Goals addressed: Coping  Interventions: Supportive and Family Systems  Summary: [name] is a 49 y.o. female who presents with improving symptoms related to depression and anxiety diagnosis. Pt reports that overall mood is stable and that she is getting fair quality and quantity of sleep.   Allowed pt to explore and express thoughts and feelings associated with recent life situations and external stressors. Discussed son's recent communication to his parents that he feels he could be bisexual. Pt and husband both very supportive of pt and love him unconditionally. Discussed pts thoughts/feelings about going into TV communication field (similar as his father).   Pt reports that she feels that she is in a good place right now emotionally.  Reviewed coping skills for anxiety and  depression and allowed pt opportunity to identify coping skills that work best for her.  Continued recommendations are as follows: self care behaviors, positive social engagements, focusing on overall work/home/life balance, and focusing on positive physical and emotional wellness.    Suicidal/Homicidal: No  Therapist Response: [name] is able to implement appropriate relaxation and diversion activities to decrease levels of anxiety. [name] is able to re-engage with friends and family members in a positive way. [name] is able to identify depression and anxiety triggers and is able to articulate mitigating factors and implement regularly to manage symptoms. This is reflective of personal growth and progress. Treatment to continue as indicated  Plan: Return again in 8 weeks. Complete CCA  Diagnosis: Axis I: MDD, recurrent; GAD    Axis II: No diagnosis    Rachel Bo Monti Villers, LCSW 10/17/2020

## 2020-10-25 IMAGING — MG MM DIGITAL DIAGNOSTIC BILAT W/ TOMO W/ CAD
8 of 14 series · 8 of 40 positions shown · non-contrast
Comparison: Previous exam(s).

CLINICAL DATA: Two palpable areas of concern in the left breast at
the 1 and 3 o'clock positions felt by the patient's physician.

EXAM:
DIGITAL DIAGNOSTIC BILATERAL MAMMOGRAM WITH CAD AND TOMO
LEFT BREAST ULTRASOUND

[R XCCL synth-2D]
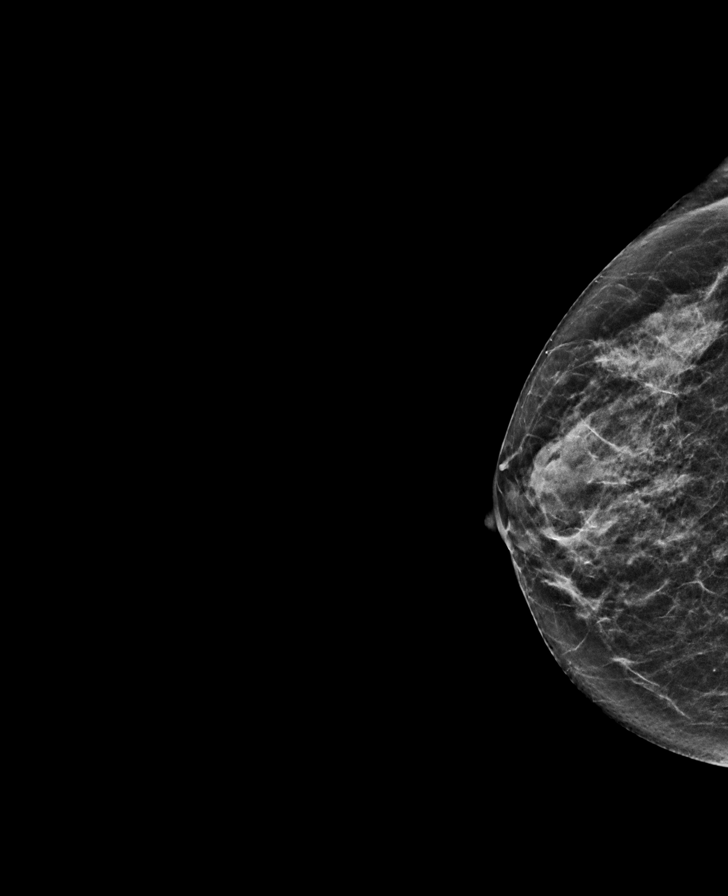

[L CC synth-2D]
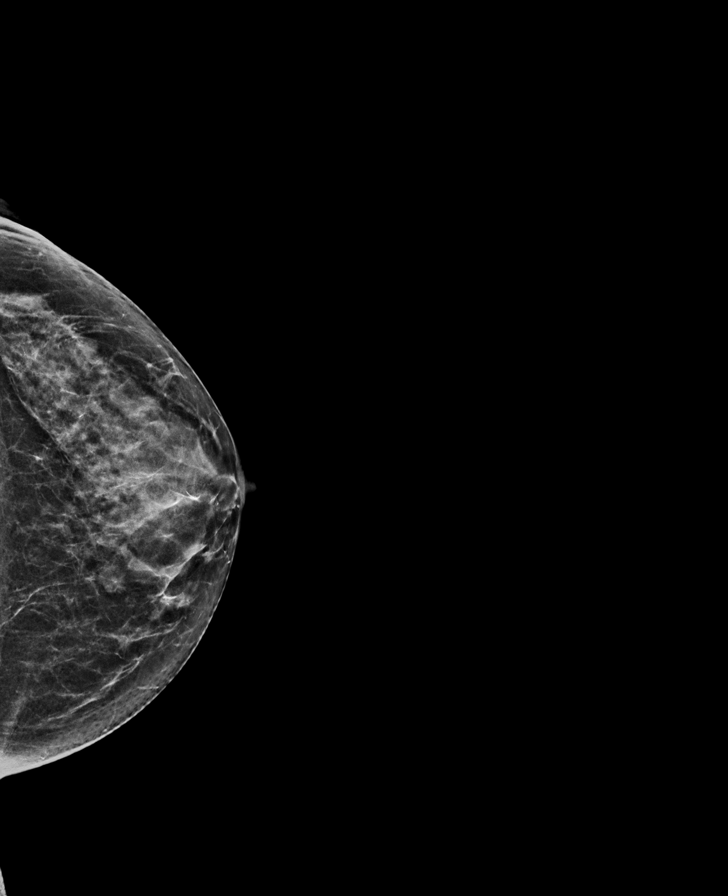

[R MLO synth-2D]
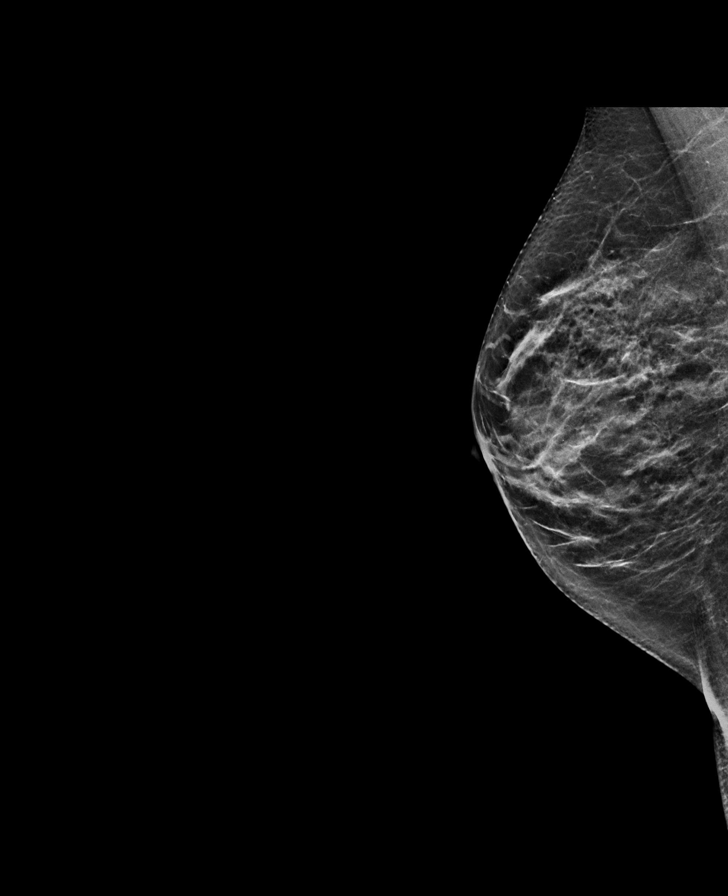

[R CC synth-2D]
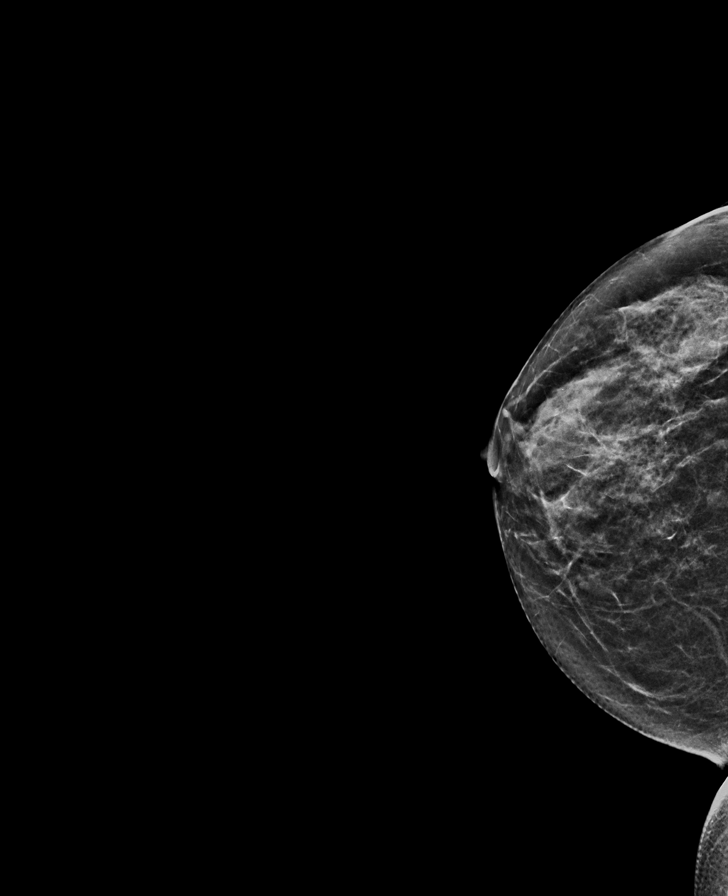

[L ML synth-2D]
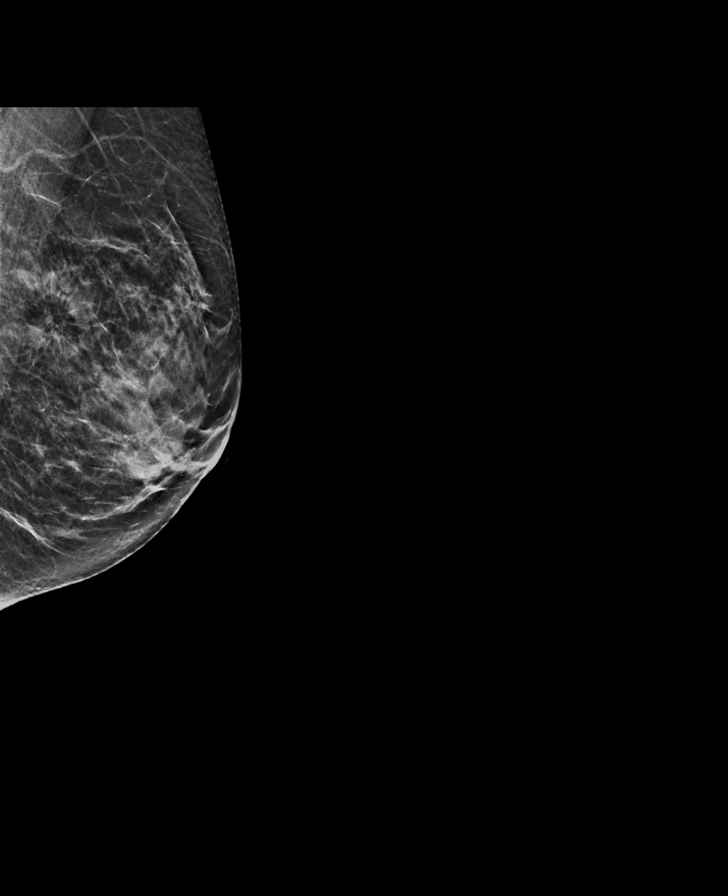

[L MLO synth-2D (1 of 2)]
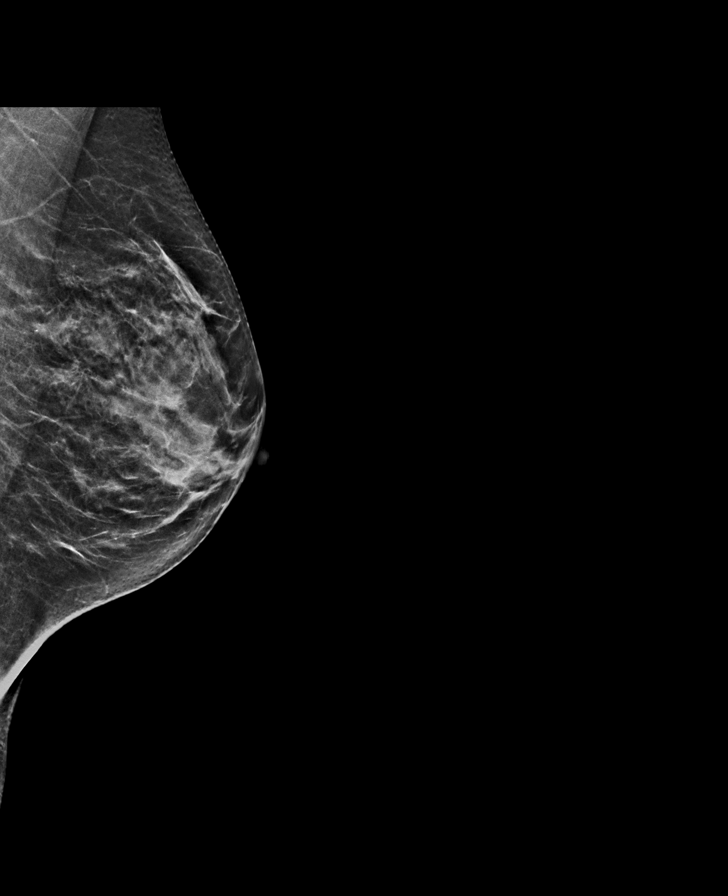

[L MLO synth-2D (2 of 2)]
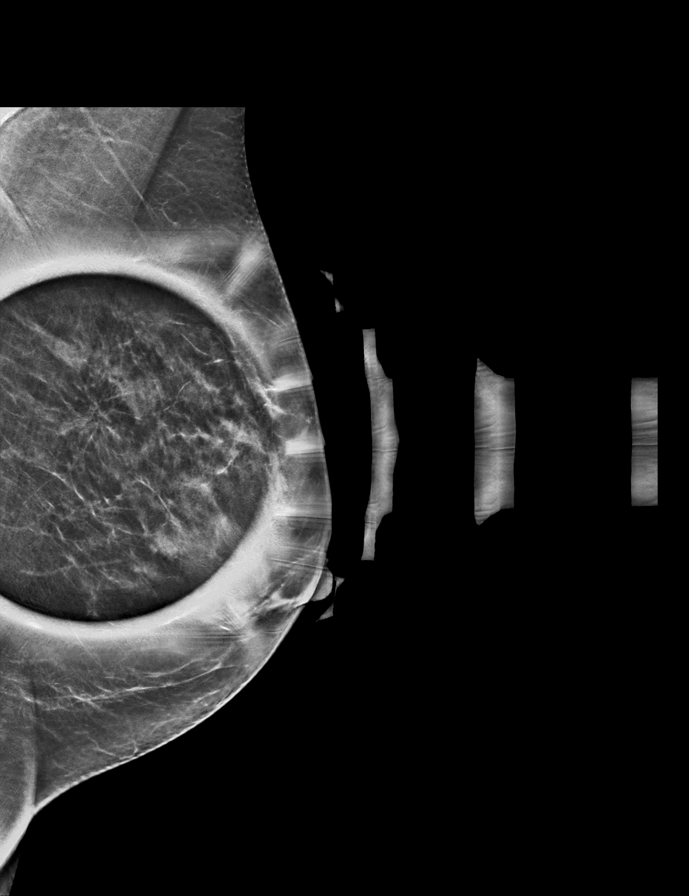

[L ML tomo · tomo slice 27/52.0]
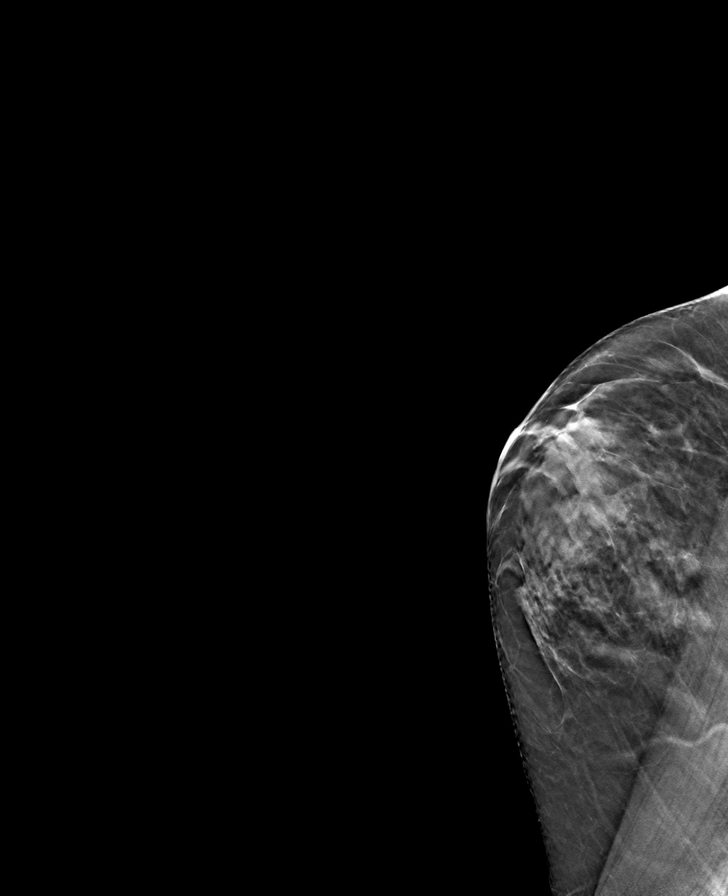

[8 of 40 positions shown; findings below may reference images not displayed]

ACR Breast Density Category c: The breast tissue is heterogeneously
dense, which may obscure small masses.
FINDINGS: No suspicious masses or calcifications are seen in the right breast.
There is an oval circumscribed mass in the upper inner left breast
measuring approximately 0.9 cm. There is a subtle area of distortion
seen in the posterosuperior left breast felt to persist on the spot
compression MLO tomograms and the full view ML tomograms.

Mammographic images were processed with CAD.

Targeted ultrasound of the upper inner left breast was performed.
There is a cluster of cysts at the 11 o'clock position 2 cm from
nipple measuring 0.8 x 0.6 x 0.9 cm. This corresponds well with the
mass seen in the upper inner left breast at mammography.

Targeted ultrasound of the upper-outer left breast in the region of
palpable abnormalities was performed. There is a cluster of cysts
seen in the left breast at 2 o'clock 3 cm from nipple measuring 1 x
0.6 by 0.6 cm. There is a hypoechoic mass with margin irregularity
at 2 o'clock 4 cm from nipple measuring 0.8 x 0.5 x 1 cm. This
likely does not correspond with the distortion seen in the left
breast at mammography. There is no definite sonographic correlate
for the distortion seen in the upper-outer posterior left breast on
mammography. No lymphadenopathy seen in the left axilla.
IMPRESSION: 1. Suspicious subtle distortion in the upper-outer posterior left
breast best visualized on the MLO tomograms.

2. Indeterminate mass in the left breast at 2 o'clock 4 cm from
nipple (this is felt to not correlate with the distortion seen in
the left breast at mammography).

RECOMMENDATION:
1. Recommend ultrasound-guided biopsy of the mass in the left breast
at 2 o'clock 4 cm from nipple.

2. Recommend stereotactic guided biopsy of the subtle distortion
seen in the upper-outer posterior left breast on the MLO view. If
this subtle distortion cannot be reproduced at the time the patient
returns for stereotactic guided biopsy, then six-month follow-up
diagnostic mammography of the left breast is recommended.

I have discussed the findings and recommendations with the patient.
If applicable, a reminder letter will be sent to the patient
regarding the next appointment.

BI-RADS CATEGORY  4: Suspicious.

## 2020-11-27 ENCOUNTER — Ambulatory Visit
Admission: RE | Admit: 2020-11-27 | Discharge: 2020-11-27 | Disposition: A | Discharge status: Home / Self Care | Payer: 59 | Source: Ambulatory Visit | Attending: Family Medicine | Admitting: Family Medicine

## 2020-11-27 ENCOUNTER — Other Ambulatory Visit: Payer: Self-pay | Admitting: Family Medicine

## 2020-11-27 ENCOUNTER — Ambulatory Visit
Admission: RE | Admit: 2020-11-27 | Discharge: 2020-11-27 | Disposition: A | Discharge status: Home / Self Care | Payer: 59 | Attending: Family Medicine | Admitting: Family Medicine

## 2020-11-27 DIAGNOSIS — R0781 Pleurodynia: Secondary | ICD-10-CM | POA: Diagnosis present

## 2020-12-26 ENCOUNTER — Other Ambulatory Visit: Payer: Self-pay

## 2020-12-26 ENCOUNTER — Ambulatory Visit (INDEPENDENT_AMBULATORY_CARE_PROVIDER_SITE_OTHER): Payer: 59 | Admitting: Licensed Clinical Social Worker

## 2020-12-26 DIAGNOSIS — F3342 Major depressive disorder, recurrent, in full remission: Secondary | ICD-10-CM

## 2020-12-26 NOTE — Progress Notes (Signed)
Virtual Visit via Video Note  I connected with [name] on 12/26/20 at 11:00 AM EDT by a video enabled telemedicine application and verified that I am speaking with the correct person using two identifiers.  Location: Patient: home Provider: remote office [place], [place])   I discussed the limitations of evaluation and management by telemedicine and the availability of in person appointments. The patient expressed understanding and agreed to proceed.   I discussed the assessment and treatment plan with the patient. The patient was provided an opportunity to ask questions and all were answered. The patient agreed with the plan and demonstrated an understanding of the instructions.   The patient was advised to call back or seek an in-person evaluation if the symptoms worsen or if the condition fails to improve as anticipated.  I provided 30 minutes of non-face-to-face time during this encounter.   Olof Marcil R Shyquan Stallbaumer, LCSW   THERAPIST PROGRESS NOTE  Session Time: 11-11:30a  Participation Level: Active  Behavioral Response: Neat and Well GroomedAlertEuthymic  Type of Therapy: Individual Therapy  Treatment Goals addressed: Anxiety and Coping  Interventions: CBT and Supportive  Summary: [name] is a 50 y.o. female who presents with improving symptoms related to depression diagnosis. Pt reports that overall mood is stable. Pt reporting good quality and quantity of sleep. Allowed pt to explore and express thoughts and feelings about recent external stressors and life events. Pt reports that son recently went back to school and that she is very happy for him. Pt also reports that her biological father recently passed away. Pt feels at peace with his passing. Allowed pt to explore her feelings about his death. Pt feels the death has been a bridge between her and her stepmother's relationship.Pt reports that she feels she is in a very good place right now mood-wise and  stress-wise. Continued recommendations are as follows: self care behaviors, positive social engagements, focusing on overall work/home/life balance, and focusing on positive physical and emotional wellness.    Suicidal/Homicidal: No  Therapist Response: Janyth is able to implement appropriate relaxation and diversion activities to decrease levels of anxiety. Onnolee is able to re-engage with friends and family members in a positive way. Elliet is able to identify depression and anxiety triggers and is able to articulate mitigating factors and implement regularly to manage symptoms. This is reflective of personal growth and progress. Treatment to continue as indicated  Plan: Return again PRN  Diagnosis: Axis I: MDD, recurrent, in full remission    Axis II: No diagnosis    [place], LCSW 12/26/2020

## 2021-01-04 ENCOUNTER — Encounter: Payer: Self-pay | Admitting: Psychiatry

## 2021-01-04 ENCOUNTER — Other Ambulatory Visit: Payer: Self-pay

## 2021-01-04 ENCOUNTER — Telehealth (INDEPENDENT_AMBULATORY_CARE_PROVIDER_SITE_OTHER): Payer: 59 | Admitting: Psychiatry

## 2021-01-04 DIAGNOSIS — F3342 Major depressive disorder, recurrent, in full remission: Secondary | ICD-10-CM

## 2021-01-04 DIAGNOSIS — F17200 Nicotine dependence, unspecified, uncomplicated: Secondary | ICD-10-CM | POA: Diagnosis not present

## 2021-01-04 DIAGNOSIS — Z634 Disappearance and death of family member: Secondary | ICD-10-CM | POA: Diagnosis not present

## 2021-01-04 DIAGNOSIS — F411 Generalized anxiety disorder: Secondary | ICD-10-CM

## 2021-01-04 MED ORDER — MIRTAZAPINE 15 MG PO TABS
ORAL_TABLET | ORAL | 2 refills | Status: AC
Start: 2021-01-04 — End: ?
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
## 2021-01-30 ENCOUNTER — Other Ambulatory Visit: Payer: Self-pay | Admitting: *Deleted

## 2021-01-30 ENCOUNTER — Telehealth: Payer: Self-pay | Admitting: *Deleted

## 2021-01-30 DIAGNOSIS — C50812 Malignant neoplasm of overlapping sites of left female breast: Secondary | ICD-10-CM

## 2021-01-30 NOTE — Telephone Encounter (Signed)
Patient called asking for mammogram order to be entered so that she can get it scheduled for her annual mammo

## 2021-01-30 NOTE — Telephone Encounter (Signed)
[name] - orders for mammo entered- pls arrange

## 2021-01-31 ENCOUNTER — Telehealth: Payer: Self-pay

## 2021-01-31 NOTE — Telephone Encounter (Signed)
received fax requesting a 90 day supply to be sent to [facility] for the mirtazapine

## 2021-01-31 NOTE — Telephone Encounter (Signed)
mirtazapine (REMERON) 15 MG tablet 30 tablet 2 01/04/2021    Sig: TAKE 1 TABLET(15 MG) BY MOUTH AT BEDTIME   Sent to pharmacy as: mirtazapine (REMERON) 15 MG tablet   E-Prescribing Status: Receipt confirmed by pharmacy (01/04/2021 10:17 AM     Mirtazapine was sent to local pharmacy for in September with 2 refills.  Please clarify with patient if she wants this medication sent to her mail order pharmacy or not.

## 2021-02-02 ENCOUNTER — Other Ambulatory Visit: Payer: Self-pay

## 2021-02-02 DIAGNOSIS — C50812 Malignant neoplasm of overlapping sites of left female breast: Secondary | ICD-10-CM

## 2021-02-02 DIAGNOSIS — Z171 Estrogen receptor negative status [ER-]: Secondary | ICD-10-CM

## 2021-02-06 ENCOUNTER — Inpatient Hospital Stay: Payer: 59 | Attending: Internal Medicine

## 2021-02-06 ENCOUNTER — Inpatient Hospital Stay (HOSPITAL_BASED_OUTPATIENT_CLINIC_OR_DEPARTMENT_OTHER): Payer: 59 | Admitting: Internal Medicine

## 2021-02-06 ENCOUNTER — Other Ambulatory Visit: Payer: Self-pay

## 2021-02-06 DIAGNOSIS — N898 Other specified noninflammatory disorders of vagina: Secondary | ICD-10-CM | POA: Diagnosis not present

## 2021-02-06 DIAGNOSIS — Z803 Family history of malignant neoplasm of breast: Secondary | ICD-10-CM | POA: Insufficient documentation

## 2021-02-06 DIAGNOSIS — R200 Anesthesia of skin: Secondary | ICD-10-CM | POA: Insufficient documentation

## 2021-02-06 DIAGNOSIS — F17210 Nicotine dependence, cigarettes, uncomplicated: Secondary | ICD-10-CM | POA: Diagnosis not present

## 2021-02-06 DIAGNOSIS — Z171 Estrogen receptor negative status [ER-]: Secondary | ICD-10-CM | POA: Insufficient documentation

## 2021-02-06 DIAGNOSIS — G4700 Insomnia, unspecified: Secondary | ICD-10-CM | POA: Diagnosis not present

## 2021-02-06 DIAGNOSIS — F419 Anxiety disorder, unspecified: Secondary | ICD-10-CM | POA: Insufficient documentation

## 2021-02-06 DIAGNOSIS — C50812 Malignant neoplasm of overlapping sites of left female breast: Secondary | ICD-10-CM | POA: Diagnosis present

## 2021-02-06 DIAGNOSIS — Z9221 Personal history of antineoplastic chemotherapy: Secondary | ICD-10-CM | POA: Diagnosis not present

## 2021-02-06 DIAGNOSIS — Z79899 Other long term (current) drug therapy: Secondary | ICD-10-CM | POA: Diagnosis not present

## 2021-02-06 DIAGNOSIS — Z006 Encounter for examination for normal comparison and control in clinical research program: Secondary | ICD-10-CM | POA: Diagnosis not present

## 2021-02-06 DIAGNOSIS — Z808 Family history of malignant neoplasm of other organs or systems: Secondary | ICD-10-CM | POA: Insufficient documentation

## 2021-02-06 DIAGNOSIS — C50912 Malignant neoplasm of unspecified site of left female breast: Secondary | ICD-10-CM

## 2021-02-06 DIAGNOSIS — R202 Paresthesia of skin: Secondary | ICD-10-CM | POA: Diagnosis not present

## 2021-02-06 DIAGNOSIS — F32A Depression, unspecified: Secondary | ICD-10-CM

## 2021-02-06 LAB — COMPREHENSIVE METABOLIC PANEL
ALT: 13 U/L (ref 0–44)
AST: 24 U/L (ref 15–41)
Albumin: 4 g/dL (ref 3.5–5.0)
Alkaline Phosphatase: 94 U/L (ref 38–126)
Anion gap: 10 (ref 5–15)
BUN: 12 mg/dL (ref 6–20)
CO2: 27 mmol/L (ref 22–32)
Calcium: 9.4 mg/dL (ref 8.9–10.3)
Chloride: 98 mmol/L (ref 98–111)
Creatinine, Ser: 0.95 mg/dL (ref 0.44–1.00)
GFR, Estimated: >60 mL/min (ref >60)
Glucose, Bld: 111 mg/dL — ABNORMAL HIGH (ref 70–99)
Potassium: 3.5 mmol/L (ref 3.5–5.1)
Sodium: 135 mmol/L (ref 135–145)
Total Bilirubin: 0.5 mg/dL (ref 0.3–1.2)
Total Protein: 7.4 g/dL (ref 6.5–8.1)

## 2021-02-06 LAB — CBC WITH DIFFERENTIAL/PLATELET
Abs Immature Granulocytes: 0.03 10*3/uL (ref 0.00–0.07)
Basophils Absolute: 0.1 10*3/uL (ref 0.0–0.1)
Basophils Relative: 1 %
Eosinophils Absolute: 0.1 10*3/uL (ref 0.0–0.5)
Eosinophils Relative: 1 %
HCT: 42 % (ref 36.0–46.0)
Hemoglobin: 14 g/dL (ref 12.0–15.0)
Immature Granulocytes: 0 %
Lymphocytes Relative: 31 %
Lymphs Abs: 3 10*3/uL (ref 0.7–4.0)
MCH: 34.3 pg — ABNORMAL HIGH (ref 26.0–34.0)
MCHC: 33.3 g/dL (ref 30.0–36.0)
MCV: 102.9 fL — ABNORMAL HIGH (ref 80.0–100.0)
Monocytes Absolute: 0.7 10*3/uL (ref 0.1–1.0)
Monocytes Relative: 8 %
Neutro Abs: 5.7 10*3/uL (ref 1.7–7.7)
Neutrophils Relative %: 59 %
Platelets: 304 10*3/uL (ref 150–400)
RBC: 4.08 MIL/uL (ref 3.87–5.11)
RDW: 13.5 % (ref 11.5–15.5)
WBC: 9.5 10*3/uL (ref 4.0–10.5)
nRBC: 0 % (ref 0.0–0.2)

## 2021-02-06 MED ORDER — CEPHALEXIN 500 MG PO CAPS: 500.0000 mg | ORAL_CAPSULE | Freq: Three times a day (TID) | ORAL | 0 refills | Status: DC

## 2021-02-06 NOTE — Assessment & Plan Note (Addendum)
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  s/p mainatence Herceptin-Perjeta. STABLE.  mammo- NOV 2021- WNL. Clinically no evidence of recurrence.  Monitor on clinical basis.  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 Foot drop [poor tol to gabapentin]; s/p accu puncture x 2.  EMG with Dr.Shah [dec 15th].   # Depression/Anxiety/insomnia/chemo brain-  on Remeron to 15 mg qhs- STABLE.   # Vaginal infected cyst- recommend kelfex 500 mg TID x5 days.  If not improved recommend evaluation with PCP/gynecology.  # DISPOSITION: # follow up in 6 months- MD; labs- cbc/cmp/ca-27-29-Dr.B
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
## 2021-02-06 NOTE — Progress Notes (Signed)
Pt states that she has developed cyst on her vaginal area that has appear about 3-4 days ago; has happened since she started chemo. Pretty painful and uncomfortable Was given an antibiotic before; not the first time it has occured. Will like to know if Dr. B is able to send an antibiotic.

## 2021-02-07 LAB — CANCER ANTIGEN 27.29: CA 27.29: 22.9 U/mL (ref 0.0–38.6)

## 2021-02-13 ENCOUNTER — Encounter: Payer: Self-pay | Admitting: Internal Medicine

## 2021-02-14 ENCOUNTER — Telehealth: Payer: Self-pay | Admitting: *Deleted

## 2021-02-14 DIAGNOSIS — F17200 Nicotine dependence, unspecified, uncomplicated: Secondary | ICD-10-CM

## 2021-02-14 NOTE — Telephone Encounter (Signed)
Patient called stating that she is past due for a lung cancer screening scan per My Chart She is asking if Dr B or Who will order it. She states her Colonoscopy is scheduled for 02/27/21. Please advise

## 2021-02-15 ENCOUNTER — Encounter: Payer: Self-pay | Admitting: Internal Medicine

## 2021-02-15 NOTE — Telephone Encounter (Signed)
Hassan Rowan- we do not set these up anymore. She would have to be referred to [place] [facility] if Dr. B is ok with the referral.

## 2021-03-05 ENCOUNTER — Ambulatory Visit
Admission: RE | Admit: 2021-03-05 | Discharge: 2021-03-05 | Disposition: A | Discharge status: Home / Self Care | Payer: 59 | Source: Ambulatory Visit | Attending: Internal Medicine | Admitting: Internal Medicine

## 2021-03-05 ENCOUNTER — Other Ambulatory Visit: Payer: Self-pay

## 2021-03-05 DIAGNOSIS — Z171 Estrogen receptor negative status [ER-]: Secondary | ICD-10-CM | POA: Diagnosis present

## 2021-03-05 DIAGNOSIS — C50812 Malignant neoplasm of overlapping sites of left female breast: Secondary | ICD-10-CM | POA: Insufficient documentation

## 2021-03-16 ENCOUNTER — Encounter: Payer: Self-pay | Admitting: Internal Medicine

## 2021-04-02 ENCOUNTER — Telehealth: Payer: Self-pay | Admitting: Internal Medicine

## 2021-04-02 NOTE — Telephone Encounter (Signed)
Received fax from Mare Ferrari, NP, [facility] stating that the patient does not qualify for the lung cancer screening program due to breast cancer DX in 2020.  Pt must be cancer free for 5 years to qualify for lung cancer screening.  Referral has been cancelled.

## 2021-04-03 ENCOUNTER — Ambulatory Visit: Payer: 59 | Admitting: Psychiatry

## 2021-04-03 NOTE — Telephone Encounter (Signed)
Patient notified

## 2021-05-11 ENCOUNTER — Encounter: Payer: Self-pay | Admitting: Internal Medicine

## 2021-08-07 ENCOUNTER — Inpatient Hospital Stay (HOSPITAL_BASED_OUTPATIENT_CLINIC_OR_DEPARTMENT_OTHER): Payer: 59 | Admitting: Internal Medicine

## 2021-08-07 ENCOUNTER — Inpatient Hospital Stay: Payer: 59 | Attending: Internal Medicine

## 2021-08-07 VITALS — BP 116/90 | HR 91 | Temp 97.1°F | Ht 64.0 in | Wt 141.6 lb

## 2021-08-07 DIAGNOSIS — F17210 Nicotine dependence, cigarettes, uncomplicated: Secondary | ICD-10-CM | POA: Diagnosis not present

## 2021-08-07 DIAGNOSIS — C50812 Malignant neoplasm of overlapping sites of left female breast: Secondary | ICD-10-CM | POA: Insufficient documentation

## 2021-08-07 DIAGNOSIS — Z171 Estrogen receptor negative status [ER-]: Secondary | ICD-10-CM | POA: Diagnosis not present

## 2021-08-07 DIAGNOSIS — Z79899 Other long term (current) drug therapy: Secondary | ICD-10-CM | POA: Insufficient documentation

## 2021-08-07 DIAGNOSIS — R059 Cough, unspecified: Secondary | ICD-10-CM

## 2021-08-07 LAB — COMPREHENSIVE METABOLIC PANEL
ALT: 19 U/L (ref 0–44)
AST: 31 U/L (ref 15–41)
Albumin: 4.1 g/dL (ref 3.5–5.0)
Alkaline Phosphatase: 95 U/L (ref 38–126)
Anion gap: 9 (ref 5–15)
BUN: 9 mg/dL (ref 6–20)
CO2: 26 mmol/L (ref 22–32)
Calcium: 9.1 mg/dL (ref 8.9–10.3)
Chloride: 97 mmol/L — ABNORMAL LOW (ref 98–111)
Creatinine, Ser: 0.62 mg/dL (ref 0.44–1.00)
GFR, Estimated: >60 mL/min (ref >60)
Glucose, Bld: 123 mg/dL — ABNORMAL HIGH (ref 70–99)
Potassium: 4 mmol/L (ref 3.5–5.1)
Sodium: 132 mmol/L — ABNORMAL LOW (ref 135–145)
Total Bilirubin: 0.5 mg/dL (ref 0.3–1.2)
Total Protein: 7.6 g/dL (ref 6.5–8.1)

## 2021-08-07 LAB — CBC WITH DIFFERENTIAL/PLATELET
Abs Immature Granulocytes: 0.02 10*3/uL (ref 0.00–0.07)
Basophils Absolute: 0.1 10*3/uL (ref 0.0–0.1)
Basophils Relative: 1 %
Eosinophils Absolute: 0 10*3/uL (ref 0.0–0.5)
Eosinophils Relative: 0 %
HCT: 43.7 % (ref 36.0–46.0)
Hemoglobin: 14.5 g/dL (ref 12.0–15.0)
Immature Granulocytes: 0 %
Lymphocytes Relative: 33 %
Lymphs Abs: 3 10*3/uL (ref 0.7–4.0)
MCH: 34.5 pg — ABNORMAL HIGH (ref 26.0–34.0)
MCHC: 33.2 g/dL (ref 30.0–36.0)
MCV: 104 fL — ABNORMAL HIGH (ref 80.0–100.0)
Monocytes Absolute: 0.8 10*3/uL (ref 0.1–1.0)
Monocytes Relative: 9 %
Neutro Abs: 5.1 10*3/uL (ref 1.7–7.7)
Neutrophils Relative %: 57 %
Platelets: 301 10*3/uL (ref 150–400)
RBC: 4.2 MIL/uL (ref 3.87–5.11)
RDW: 13.2 % (ref 11.5–15.5)
WBC: 9 10*3/uL (ref 4.0–10.5)
nRBC: 0 % (ref 0.0–0.2)

## 2021-08-07 NOTE — Progress Notes (Signed)
C/o coughing, tickle in throat since chemo, mostly qhs when laying down. Would like CT of chest. ?

## 2021-08-07 NOTE — Assessment & Plan Note (Addendum)
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  s/p mainatence Herceptin-Perjeta. STABLE.  mammo bil- NOV 2022- WNL. Clinically no evidence of recurrence.  Monitor on clinical basis. STABLE.  ? ?# PN [clinical trial]-grade-1; STABLE.? Foot drop [poor tol to gabapentin]; s/p accu puncture x 2.  EMG with Dr.Shah [dec 15th]. STABLE.  ? ?# Depression/Anxiety/insomnia/chemo brain-  on Remeron to 15 mg qhs- STABLE.  ? ?# Cough/tickle in throat-recommend antihistamine.  Active smoker-recommend quitting smoking.  Recommend CXR-for further evaluation. ? ?# DISPOSITION: my chart- TM/CRX ?# follow up in 6 months- MD; labs- cbc/cmp/ca-27-29-Dr.B ?
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
## 2021-08-08 LAB — CANCER ANTIGEN 27.29: CA 27.29: 24.5 U/mL (ref 0.0–38.6)

## 2021-09-27 ENCOUNTER — Ambulatory Visit
Admission: RE | Admit: 2021-09-27 | Discharge: 2021-09-27 | Disposition: A | Discharge status: Home / Self Care | Payer: 59 | Source: Ambulatory Visit | Attending: Radiation Oncology | Admitting: Radiation Oncology

## 2021-09-27 ENCOUNTER — Encounter: Payer: Self-pay | Admitting: Radiation Oncology

## 2021-09-27 VITALS — BP 113/84 | Temp 98.2°F | Resp 16 | Ht 64.0 in | Wt 141.6 lb

## 2021-09-27 DIAGNOSIS — Z171 Estrogen receptor negative status [ER-]: Secondary | ICD-10-CM

## 2021-09-27 NOTE — Progress Notes (Signed)
Radiation Oncology Follow up Note  Name: [name]   Date:   09/27/2021 MRN:  [id number] DOB: 02/10/1971    This 51 y.o. female presents to the clinic today for 2-year follow-up status post whole breast radiation to her left breast for ER/PR negative HER2/neu overexpressed invasive mammary carcinoma.  REFERRING PROVIDER: Sharyne Peach, MD  HPI: Patient is a 51 year old female now out 2 years having completed whole breast radiation to her left breast for ER/PR negative HER2/neu overexpressed invasive mammary carcinoma seen today in routine follow-up she is doing well she specifically denies breast tenderness cough or bone pain.  She is not on antiestrogen therapy based on the ER/PR negative nature of her disease.  Mammograms were reviewed back from November showing BI-RADS 2 benign findings. COMPLICATIONS OF TREATMENT: none  FOLLOW UP COMPLIANCE: keeps appointments   PHYSICAL EXAM:  BP 113/84 (BP Location: Right Arm, Patient Position: Sitting, Cuff Size: Normal)   Temp 98.2 F (36.8 C) (Tympanic)   Resp 16   Ht _0  (1.626 m)   Wt 141 lb 9.6 oz (64.2 kg)   LMP 02/19/2016   BMI 24.31 kg/m  Lungs are clear to A&P cardiac examination essentially unremarkable with regular rate and rhythm. No dominant mass or nodularity is noted in either breast in 2 positions examined. Incision is well-healed. No axillary or supraclavicular adenopathy is appreciated. Cosmetic result is excellent.  Well-developed well-nourished patient in NAD. HEENT reveals PERLA, EOMI, discs not visualized.  Oral cavity is clear. No oral mucosal lesions are identified. Neck is clear without evidence of cervical or supraclavicular adenopathy. Lungs are clear to A&P. Cardiac examination is essentially unremarkable with regular rate and rhythm without murmur rub or thrill. Abdomen is benign with no organomegaly or masses noted. Motor sensory and DTR levels are equal and symmetric in the upper and lower extremities. Cranial  nerves II through XII are grossly intact. Proprioception is intact. No peripheral adenopathy or edema is identified. No motor or sensory levels are noted. Crude visual fields are within normal range.  RADIOLOGY RESULTS: Mammograms reviewed compatible with above-stated findings  PLAN: Present time patient continues to do well with no evidence of disease on pleased with her overall progress.  I have asked to see her back in 1 year for follow-up.  Patient knows to call with any concerns.  I would like to take this opportunity to thank you for allowing me to participate in the care of your patient.Noreene Filbert, MD

## 2021-11-19 ENCOUNTER — Other Ambulatory Visit: Payer: Self-pay

## 2022-02-22 ENCOUNTER — Inpatient Hospital Stay: Payer: 59 | Attending: Internal Medicine

## 2022-02-22 ENCOUNTER — Encounter: Payer: Self-pay | Admitting: Internal Medicine

## 2022-02-22 ENCOUNTER — Inpatient Hospital Stay: Payer: 59 | Admitting: Internal Medicine

## 2022-02-22 DIAGNOSIS — Z171 Estrogen receptor negative status [ER-]: Secondary | ICD-10-CM | POA: Insufficient documentation

## 2022-02-22 DIAGNOSIS — C50812 Malignant neoplasm of overlapping sites of left female breast: Secondary | ICD-10-CM | POA: Insufficient documentation

## 2022-02-22 DIAGNOSIS — C50912 Malignant neoplasm of unspecified site of left female breast: Secondary | ICD-10-CM

## 2022-02-22 DIAGNOSIS — F17210 Nicotine dependence, cigarettes, uncomplicated: Secondary | ICD-10-CM | POA: Insufficient documentation

## 2022-02-22 LAB — CBC WITH DIFFERENTIAL/PLATELET
Abs Immature Granulocytes: 0.03 10*3/uL (ref 0.00–0.07)
Basophils Absolute: 0.1 10*3/uL (ref 0.0–0.1)
Basophils Relative: 1 %
Eosinophils Absolute: 0.1 10*3/uL (ref 0.0–0.5)
Eosinophils Relative: 0 %
HCT: 44.6 % (ref 36.0–46.0)
Hemoglobin: 15.5 g/dL — ABNORMAL HIGH (ref 12.0–15.0)
Immature Granulocytes: 0 %
Lymphocytes Relative: 30 %
Lymphs Abs: 3.4 10*3/uL (ref 0.7–4.0)
MCH: 35.6 pg — ABNORMAL HIGH (ref 26.0–34.0)
MCHC: 34.8 g/dL (ref 30.0–36.0)
MCV: 102.3 fL — ABNORMAL HIGH (ref 80.0–100.0)
Monocytes Absolute: 0.7 10*3/uL (ref 0.1–1.0)
Monocytes Relative: 6 %
Neutro Abs: 7 10*3/uL (ref 1.7–7.7)
Neutrophils Relative %: 63 %
Platelets: 296 10*3/uL (ref 150–400)
RBC: 4.36 MIL/uL (ref 3.87–5.11)
RDW: 13 % (ref 11.5–15.5)
WBC: 11.4 10*3/uL — ABNORMAL HIGH (ref 4.0–10.5)
nRBC: 0 % (ref 0.0–0.2)

## 2022-02-22 LAB — COMPREHENSIVE METABOLIC PANEL
ALT: 29 U/L (ref 0–44)
AST: 46 U/L — ABNORMAL HIGH (ref 15–41)
Albumin: 3.9 g/dL (ref 3.5–5.0)
Alkaline Phosphatase: 92 U/L (ref 38–126)
Anion gap: 12 (ref 5–15)
BUN: 11 mg/dL (ref 6–20)
CO2: 23 mmol/L (ref 22–32)
Calcium: 9.1 mg/dL (ref 8.9–10.3)
Chloride: 105 mmol/L (ref 98–111)
Creatinine, Ser: 0.7 mg/dL (ref 0.44–1.00)
GFR, Estimated: >60 mL/min (ref >60)
Glucose, Bld: 124 mg/dL — ABNORMAL HIGH (ref 70–99)
Potassium: 3.4 mmol/L — ABNORMAL LOW (ref 3.5–5.1)
Sodium: 140 mmol/L (ref 135–145)
Total Bilirubin: 0.2 mg/dL — ABNORMAL LOW (ref 0.3–1.2)
Total Protein: 7.6 g/dL (ref 6.5–8.1)
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
## 2022-02-22 NOTE — Assessment & Plan Note (Addendum)
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  s/p mainatence Herceptin-Perjeta. mammo bil- NOV 2022- WNL. Clinically no evidence of recurrence.  Monitor on clinical basis. STABLE.   # PN [clinical trial]-grade-1; STABLE.? Foot drop [poor tol to gabapentin]; s/p accu puncture x 2.  EMG with Dr.Shah [dec 15th]. STABLE; discussed re: ACCRUE study- wants to hold off for now.   # Depression/Anxiety/insomnia/chemo brain-  on Remeron to 15 mg qhs- STABLE.   # Active smoker-recommend quitting smoking. Defer to PCP. STABLE.     my chart- TM # DISPOSITION:  # Bil mammo- NOV 2023 # follow up in 6 months- MD; labs- cbc/cmp/ca-27-29; B12  -Dr.B

## 2022-02-22 NOTE — Progress Notes (Signed)
Patient denies new problems/concerns today.   °

## 2022-02-22 NOTE — Research (Signed)
SWOG [id number] Week Protocol Visit:   Patient in to cancer center unaccompanied for her scheduled Week 156 visit for the S1714 Peripheral Neuropathy study. The patient was given her questionnaires to complete while waiting for her appointment with Dr. Rogue Bussing.  All questionnaires were completed without any difficulty. Solicited AE's reviewed by Dr. Rogue Bussing with patient, she has numbness in her hands and feet still. 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 She attributes the numbness to her bilateral carpal tunnel syndrome for which is a continuing medical condition for her. The Orthopaedist wants her to have surgery for which she isn't sure about currently.She is not taking any medication for neuropathy and does not want to at this time. She has also had acupuncture and seen a neurologist without any improvement in the neuropathy. Patient was reminded the ACCRU [place]-2102 protocol with study drug PEA is still open currently if she is interested. She states she doesn't want to do anything right now. She rated her symptom toxicity at 5/6 out of 10. The patient was encouraged to call this research nurse for any questions or concerns or if she does decide to participate in the [place] study. A total of thirty minutes were spent with the patient for all required protocol activities.  Jeral Fruit, RN, BSN, OCN 02/22/2020 0955 am  Adverse Event Log  Study/Protocol: SWOG S1714   Week 156  Event Grade Onset Date Resolved Date Drug Name Attribution Treatment Comments  Sensory Peripheral Neuropathy 1 05/03/2019 02/22/2022 02/06/2021 Taxol 1-unrelated    Constipation 0 05/03/2019 08/20/2019 Taxol     Pain- abdominal 0 05/03/2019 08/20/2019      Pain 0 08/20/2019 02/22/2020   Tylenol Post Lumpectomy   Fatigue 0 08/20/2019 02/06/2021 Taxol     Peripheral Motor Neuropathy 0 02/22/2020 02/06/2021 Taxol     Paresthesia 1 02/22/2020 02/22/2022 02/06/2021 Taxol 1-unrelated

## 2022-02-23 LAB — CANCER ANTIGEN 27.29: CA 27.29: 24.7 U/mL (ref 0.0–38.6)

## 2022-04-02 ENCOUNTER — Ambulatory Visit
Admission: RE | Admit: 2022-04-02 | Discharge: 2022-04-02 | Disposition: A | Discharge status: Home / Self Care | Payer: 59 | Source: Ambulatory Visit | Attending: Internal Medicine | Admitting: Internal Medicine

## 2022-04-02 DIAGNOSIS — Z171 Estrogen receptor negative status [ER-]: Secondary | ICD-10-CM | POA: Diagnosis not present

## 2022-04-02 DIAGNOSIS — Z1231 Encounter for screening mammogram for malignant neoplasm of breast: Secondary | ICD-10-CM | POA: Insufficient documentation

## 2022-04-02 DIAGNOSIS — C50812 Malignant neoplasm of overlapping sites of left female breast: Secondary | ICD-10-CM | POA: Insufficient documentation

## 2022-04-04 ENCOUNTER — Other Ambulatory Visit: Payer: Self-pay | Admitting: Family Medicine

## 2022-04-04 DIAGNOSIS — F17210 Nicotine dependence, cigarettes, uncomplicated: Secondary | ICD-10-CM

## 2022-04-15 ENCOUNTER — Ambulatory Visit
Admission: RE | Admit: 2022-04-15 | Discharge: 2022-04-15 | Disposition: A | Discharge status: Home / Self Care | Payer: 59 | Source: Ambulatory Visit | Attending: Family Medicine | Admitting: Family Medicine

## 2022-04-15 DIAGNOSIS — F17210 Nicotine dependence, cigarettes, uncomplicated: Secondary | ICD-10-CM | POA: Insufficient documentation

## 2022-08-21 ENCOUNTER — Inpatient Hospital Stay: Payer: 59 | Admitting: Internal Medicine

## 2022-08-21 ENCOUNTER — Encounter: Payer: Self-pay | Admitting: Internal Medicine

## 2022-08-21 ENCOUNTER — Inpatient Hospital Stay: Payer: 59 | Attending: Internal Medicine

## 2022-08-21 VITALS — BP 119/100 | HR 106 | Temp 98.0°F | Ht 64.0 in | Wt 137.8 lb

## 2022-08-21 DIAGNOSIS — Z171 Estrogen receptor negative status [ER-]: Secondary | ICD-10-CM

## 2022-08-21 DIAGNOSIS — Z853 Personal history of malignant neoplasm of breast: Secondary | ICD-10-CM | POA: Insufficient documentation

## 2022-08-21 DIAGNOSIS — F17210 Nicotine dependence, cigarettes, uncomplicated: Secondary | ICD-10-CM | POA: Insufficient documentation

## 2022-08-21 DIAGNOSIS — C50812 Malignant neoplasm of overlapping sites of left female breast: Secondary | ICD-10-CM | POA: Diagnosis not present

## 2022-08-21 DIAGNOSIS — Z08 Encounter for follow-up examination after completed treatment for malignant neoplasm: Secondary | ICD-10-CM | POA: Diagnosis not present

## 2022-08-21 LAB — CBC WITH DIFFERENTIAL/PLATELET
Abs Immature Granulocytes: 0.05 10*3/uL (ref 0.00–0.07)
Basophils Absolute: 0.1 10*3/uL (ref 0.0–0.1)
Basophils Relative: 1 %
Eosinophils Absolute: 0.1 10*3/uL (ref 0.0–0.5)
Eosinophils Relative: 1 %
HCT: 46.5 % — ABNORMAL HIGH (ref 36.0–46.0)
Hemoglobin: 15.9 g/dL — ABNORMAL HIGH (ref 12.0–15.0)
Immature Granulocytes: 0 %
Lymphocytes Relative: 40 %
Lymphs Abs: 4.8 10*3/uL — ABNORMAL HIGH (ref 0.7–4.0)
MCH: 34.6 pg — ABNORMAL HIGH (ref 26.0–34.0)
MCHC: 34.2 g/dL (ref 30.0–36.0)
MCV: 101.1 fL — ABNORMAL HIGH (ref 80.0–100.0)
Monocytes Absolute: 0.8 10*3/uL (ref 0.1–1.0)
Monocytes Relative: 6 %
Neutro Abs: 6.1 10*3/uL (ref 1.7–7.7)
Neutrophils Relative %: 52 %
Platelets: 305 10*3/uL (ref 150–400)
RBC: 4.6 MIL/uL (ref 3.87–5.11)
RDW: 13.2 % (ref 11.5–15.5)
WBC: 11.8 10*3/uL — ABNORMAL HIGH (ref 4.0–10.5)
nRBC: 0 % (ref 0.0–0.2)

## 2022-08-21 LAB — COMPREHENSIVE METABOLIC PANEL
ALT: 20 U/L (ref 0–44)
AST: 31 U/L (ref 15–41)
Albumin: 4.4 g/dL (ref 3.5–5.0)
Alkaline Phosphatase: 91 U/L (ref 38–126)
Anion gap: 15 (ref 5–15)
BUN: 11 mg/dL (ref 6–20)
CO2: 23 mmol/L (ref 22–32)
Calcium: 10.1 mg/dL (ref 8.9–10.3)
Chloride: 99 mmol/L (ref 98–111)
Creatinine, Ser: 0.76 mg/dL (ref 0.44–1.00)
GFR, Estimated: >60 mL/min (ref >60)
Glucose, Bld: 121 mg/dL — ABNORMAL HIGH (ref 70–99)
Potassium: 4 mmol/L (ref 3.5–5.1)
Sodium: 137 mmol/L (ref 135–145)
Total Bilirubin: 0.5 mg/dL (ref 0.3–1.2)
Total Protein: 8.5 g/dL — ABNORMAL HIGH (ref 6.5–8.1)

## 2022-08-21 NOTE — Assessment & Plan Note (Addendum)
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  s/p mainatence Herceptin-Perjeta. mammo bil- NOV 2023- WNL. Clinically no evidence of recurrence.  Monitor on clinical basis.  stable     # PN [clinical trial]-grade-1; .? Foot drop [poor tol to gabapentin]; s/p accu puncture x 2.  EMG with Dr.Shah [dec 15th].  stable     # Erythrocytosis- Hb 15.3- likely sec to smoking. Monitor for now.  Hold off any workup with this time.  # Elevated BP- will repeat BP again-   # Depression/Anxiety/insomnia/chemo brain-  on Remeron to 15 mg qhs- stable     # Active smoker-recommend quitting smoking. Currently 2 pack/week-  stable     #  CT- [facility]- sec 2023 medial left lower lobe measuring 7.6 mm; awaiting in 6 month-June 2024 [PCP]  my chart- TM # DISPOSITION:  # Repeat BP # follow up in 6 months- MD; labs- cbc/cmp/ca-27-29; B12  -Dr.B

## 2022-08-21 NOTE — Progress Notes (Signed)
No concerns today 

## 2022-08-21 NOTE — Addendum Note (Signed)
Addended by: Darrold Span A on: 08/21/2022 04:18 PM   Modules accepted: Orders
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
## 2022-08-22 LAB — CANCER ANTIGEN 27.29: CA 27.29: 29.1 U/mL (ref 0.0–38.6)

## 2022-09-11 ENCOUNTER — Other Ambulatory Visit: Payer: Self-pay | Admitting: Hematology and Oncology

## 2022-09-19 ENCOUNTER — Encounter: Payer: Self-pay | Admitting: *Deleted

## 2022-10-03 ENCOUNTER — Ambulatory Visit: Payer: 59 | Admitting: Radiation Oncology

## 2022-10-14 ENCOUNTER — Encounter: Payer: Self-pay | Admitting: Radiation Oncology

## 2022-10-14 ENCOUNTER — Ambulatory Visit
Admission: RE | Admit: 2022-10-14 | Discharge: 2022-10-14 | Disposition: A | Discharge status: Home / Self Care | Payer: 59 | Source: Ambulatory Visit | Attending: Radiation Oncology | Admitting: Radiation Oncology

## 2022-10-14 DIAGNOSIS — Z923 Personal history of irradiation: Secondary | ICD-10-CM | POA: Diagnosis not present

## 2022-10-14 DIAGNOSIS — Z171 Estrogen receptor negative status [ER-]: Secondary | ICD-10-CM

## 2022-10-14 DIAGNOSIS — Z853 Personal history of malignant neoplasm of breast: Secondary | ICD-10-CM | POA: Diagnosis present

## 2022-10-14 NOTE — Progress Notes (Signed)
Radiation Oncology Follow up Note  Name: [name]   Date:   10/14/2022 MRN:  [id number] DOB: 1970/10/06    This 52 y.o. female presents to the clinic today for 3-year follow-up status post whole breast radiation to her left breast for ER/PR negative HER2/neu overexpressed invasive mammary carcinoma.  REFERRING PROVIDER: Rayetta Humphrey, MD  HPI: Patient is a 52 year old female now out over 3 years having completed whole breast radiation to her left breast for ER/PR negative HER2/neu overexpressed invasive mammary carcinoma.  Seen today in routine follow-up she is doing well.  She specifically denies breast tenderness cough or bone pain..  She had mammograms back in December which I have reviewed were BI-RADS 1 negative.  She also had a CT scan of her chest CT screening showing probable benign findings lung RADS 3 with 39-month follow-up recommended..  She is not on endocrine therapy based on the ER/PR status of her tumor  COMPLICATIONS OF TREATMENT: none  FOLLOW UP COMPLIANCE: keeps appointments   PHYSICAL EXAM:  LMP 02/19/2016  Lungs are clear to A&P cardiac examination essentially unremarkable with regular rate and rhythm. No dominant mass or nodularity is noted in either breast in 2 positions examined. Incision is well-healed. No axillary or supraclavicular adenopathy is appreciated. Cosmetic result is excellent.  Well-developed well-nourished patient in NAD. HEENT reveals PERLA, EOMI, discs not visualized.  Oral cavity is clear. No oral mucosal lesions are identified. Neck is clear without evidence of cervical or supraclavicular adenopathy. Lungs are clear to A&P. Cardiac examination is essentially unremarkable with regular rate and rhythm without murmur rub or thrill. Abdomen is benign with no organomegaly or masses noted. Motor sensory and DTR levels are equal and symmetric in the upper and lower extremities. Cranial nerves II through XII are grossly intact. Proprioception is intact.  No peripheral adenopathy or edema is identified. No motor or sensory levels are noted. Crude visual fields are within normal range.  RADIOLOGY RESULTS: Mammogram and CT scan reviewed compatible with above-stated findings  PLAN: Present time patient is doing well now out over 3 years with no evidence of disease.  I am going to discontinue follow-up care at this point.  Be happy to reevaluate the patient in future therapy indicated.  Patient is to call with any concerns.  I would like to take this opportunity to thank you for allowing me to participate in the care of your patient.Carmina Miller, MD

## 2022-10-28 ENCOUNTER — Other Ambulatory Visit: Payer: Self-pay | Admitting: Family Medicine

## 2022-10-28 DIAGNOSIS — F17210 Nicotine dependence, cigarettes, uncomplicated: Secondary | ICD-10-CM

## 2022-11-05 ENCOUNTER — Ambulatory Visit
Admission: RE | Admit: 2022-11-05 | Discharge: 2022-11-05 | Disposition: A | Discharge status: Home / Self Care | Payer: 59 | Source: Ambulatory Visit | Attending: Family Medicine | Admitting: Family Medicine

## 2022-11-05 DIAGNOSIS — F17210 Nicotine dependence, cigarettes, uncomplicated: Secondary | ICD-10-CM | POA: Diagnosis present

## 2023-02-21 ENCOUNTER — Inpatient Hospital Stay: Payer: 59 | Admitting: Nurse Practitioner

## 2023-02-21 ENCOUNTER — Inpatient Hospital Stay: Payer: 59

## 2023-03-06 ENCOUNTER — Other Ambulatory Visit: Payer: Self-pay

## 2023-03-06 ENCOUNTER — Inpatient Hospital Stay: Payer: 59 | Attending: Internal Medicine

## 2023-03-06 ENCOUNTER — Encounter: Payer: Self-pay | Admitting: Nurse Practitioner

## 2023-03-06 ENCOUNTER — Inpatient Hospital Stay: Payer: 59 | Admitting: Nurse Practitioner

## 2023-03-06 VITALS — BP 116/90 | HR 92 | Temp 97.7°F | Wt 137.0 lb

## 2023-03-06 DIAGNOSIS — F17210 Nicotine dependence, cigarettes, uncomplicated: Secondary | ICD-10-CM | POA: Insufficient documentation

## 2023-03-06 DIAGNOSIS — F4323 Adjustment disorder with mixed anxiety and depressed mood: Secondary | ICD-10-CM | POA: Diagnosis not present

## 2023-03-06 DIAGNOSIS — Z171 Estrogen receptor negative status [ER-]: Secondary | ICD-10-CM

## 2023-03-06 DIAGNOSIS — D751 Secondary polycythemia: Secondary | ICD-10-CM | POA: Diagnosis not present

## 2023-03-06 DIAGNOSIS — Z08 Encounter for follow-up examination after completed treatment for malignant neoplasm: Secondary | ICD-10-CM | POA: Diagnosis not present

## 2023-03-06 DIAGNOSIS — Z79899 Other long term (current) drug therapy: Secondary | ICD-10-CM | POA: Insufficient documentation

## 2023-03-06 DIAGNOSIS — Z853 Personal history of malignant neoplasm of breast: Secondary | ICD-10-CM | POA: Diagnosis not present

## 2023-03-06 DIAGNOSIS — C50812 Malignant neoplasm of overlapping sites of left female breast: Secondary | ICD-10-CM | POA: Insufficient documentation

## 2023-03-06 LAB — CMP (CANCER CENTER ONLY)
ALT: 36 U/L (ref 0–44)
AST: 51 U/L — ABNORMAL HIGH (ref 15–41)
Albumin: 4.1 g/dL (ref 3.5–5.0)
Alkaline Phosphatase: 67 U/L (ref 38–126)
Anion gap: 12 (ref 5–15)
BUN: 8 mg/dL (ref 6–20)
CO2: 22 mmol/L (ref 22–32)
Calcium: 9.3 mg/dL (ref 8.9–10.3)
Chloride: 104 mmol/L (ref 98–111)
Creatinine: 0.7 mg/dL (ref 0.44–1.00)
GFR, Estimated: >60 mL/min (ref >60)
Glucose, Bld: 102 mg/dL — ABNORMAL HIGH (ref 70–99)
Potassium: 3.5 mmol/L (ref 3.5–5.1)
Sodium: 138 mmol/L (ref 135–145)
Total Bilirubin: 0.5 mg/dL (ref <1.2)
Total Protein: 7.7 g/dL (ref 6.5–8.1)

## 2023-03-06 LAB — CBC WITH DIFFERENTIAL (CANCER CENTER ONLY)
Abs Immature Granulocytes: 0.02 10*3/uL (ref 0.00–0.07)
Basophils Absolute: 0.1 10*3/uL (ref 0.0–0.1)
Basophils Relative: 1 %
Eosinophils Absolute: 0.1 10*3/uL (ref 0.0–0.5)
Eosinophils Relative: 1 %
HCT: 43.3 % (ref 36.0–46.0)
Hemoglobin: 14.9 g/dL (ref 12.0–15.0)
Immature Granulocytes: 0 %
Lymphocytes Relative: 42 %
Lymphs Abs: 3.7 10*3/uL (ref 0.7–4.0)
MCH: 35.3 pg — ABNORMAL HIGH (ref 26.0–34.0)
MCHC: 34.4 g/dL (ref 30.0–36.0)
MCV: 102.6 fL — ABNORMAL HIGH (ref 80.0–100.0)
Monocytes Absolute: 0.8 10*3/uL (ref 0.1–1.0)
Monocytes Relative: 9 %
Neutro Abs: 4.2 10*3/uL (ref 1.7–7.7)
Neutrophils Relative %: 47 %
Platelet Count: 262 10*3/uL (ref 150–400)
RBC: 4.22 MIL/uL (ref 3.87–5.11)
RDW: 13 % (ref 11.5–15.5)
WBC Count: 8.9 10*3/uL (ref 4.0–10.5)
nRBC: 0 % (ref 0.0–0.2)

## 2023-03-06 LAB — VITAMIN B12: Vitamin B-12: 759 pg/mL (ref 180–914)

## 2023-03-06 LAB — MAGNESIUM: Magnesium: 2 mg/dL (ref 1.7–2.4)
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
## 2023-03-07 LAB — CANCER ANTIGEN 27.29: CA 27.29: 22.4 U/mL (ref 0.0–38.6)

## 2023-03-19 DIAGNOSIS — F411 Generalized anxiety disorder: Secondary | ICD-10-CM | POA: Diagnosis not present

## 2023-03-19 DIAGNOSIS — F4323 Adjustment disorder with mixed anxiety and depressed mood: Secondary | ICD-10-CM | POA: Diagnosis not present

## 2023-03-30 DIAGNOSIS — C50812 Malignant neoplasm of overlapping sites of left female breast: Secondary | ICD-10-CM | POA: Diagnosis not present

## 2023-03-30 DIAGNOSIS — F411 Generalized anxiety disorder: Secondary | ICD-10-CM | POA: Diagnosis not present

## 2023-03-30 DIAGNOSIS — F331 Major depressive disorder, recurrent, moderate: Secondary | ICD-10-CM | POA: Diagnosis not present

## 2023-03-30 NOTE — Progress Notes (Signed)
[facility] - Final Intake Summary  [name] (07-09-1970) is a 52 y/o white female dx'd with stage IA ER/PR-, HER2+ positive breast cancer in 01/2019, referred by Consuello Masse at [facility] for depression, anxiety, and insomnia. [name] is now cancer-free but still gets routine check-ups at [facility]. She was referred to psychiatry for med management alongside CC referral, and understands no psych med recs will be made via CC unless we are sole prescriber. At time of CC referral, [name] has diagnoses of MDD, GAD, tobacco use disorder, and mixed adjustment in her chart.   Assessments:  [name] scored PHQ-9=10 (moderate), reporting low mood, fatigue, fluctuating eating patterns, hypersomnia after her ex-husband left (9/8), chronic trouble staying asleep, feeling bad about herself every day, and passive SI on several days over the past 2 weeks.  CSSR-S score: Medium Risk. [name] reported specific thoughts as to method (jumping off a nearby bridge) in recent past, however not within the past 2 weeks. She stated multiple times that she would never commit suicide, citing her son and fear as main protective factors. No prior attempts reported, but does have hx of SI dating back to youth. Member denied any level of intention or planning around jumping off the nearby bridge.  [name] scored GAD-7=7 (mild), reporting several days of irritability and nervousness, and over 1/2 the days worrying w/o ability to control.   Psych Tx/Hx: [name] is currently prescribed Remeron 15mg  nightly for sleep. In the past she has taken citalopram, gabapentin, hydroxyzine, trazodone, and effexor. She has been involved in therapy and psychiatry in the past but has not been for several years. She is now reengaging in both d/t unexpected separation from her husband.   Psychosocial Hx: [name] is recently separated, and now living alone. She has a 62 y/o son named [name] who she is close with. 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 Lately she has been working on being more active and works out every AM at home.  Assigned Dx: F43.23: adjustment disorder with mixed anxiety & depressed mood  --  [name] [facility] Manager

## 2023-04-04 ENCOUNTER — Ambulatory Visit
Admission: RE | Admit: 2023-04-04 | Discharge: 2023-04-04 | Disposition: A | Discharge status: Home / Self Care | Payer: 59 | Source: Ambulatory Visit | Attending: Nurse Practitioner | Admitting: Nurse Practitioner

## 2023-04-04 DIAGNOSIS — Z1231 Encounter for screening mammogram for malignant neoplasm of breast: Secondary | ICD-10-CM | POA: Insufficient documentation

## 2023-04-04 DIAGNOSIS — Z853 Personal history of malignant neoplasm of breast: Secondary | ICD-10-CM | POA: Diagnosis not present

## 2023-04-04 DIAGNOSIS — Z08 Encounter for follow-up examination after completed treatment for malignant neoplasm: Secondary | ICD-10-CM | POA: Insufficient documentation

## 2023-05-08 ENCOUNTER — Other Ambulatory Visit: Payer: Self-pay | Admitting: Nurse Practitioner

## 2023-05-30 DIAGNOSIS — C50412 Malignant neoplasm of upper-outer quadrant of left female breast: Secondary | ICD-10-CM | POA: Diagnosis not present

## 2023-05-30 DIAGNOSIS — F411 Generalized anxiety disorder: Secondary | ICD-10-CM | POA: Diagnosis not present

## 2023-05-30 DIAGNOSIS — F331 Major depressive disorder, recurrent, moderate: Secondary | ICD-10-CM | POA: Diagnosis not present

## 2023-06-27 DIAGNOSIS — F411 Generalized anxiety disorder: Secondary | ICD-10-CM | POA: Diagnosis not present

## 2023-06-27 DIAGNOSIS — C50412 Malignant neoplasm of upper-outer quadrant of left female breast: Secondary | ICD-10-CM | POA: Diagnosis not present

## 2023-06-27 DIAGNOSIS — F331 Major depressive disorder, recurrent, moderate: Secondary | ICD-10-CM | POA: Diagnosis not present
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
## 2023-07-01 ENCOUNTER — Ambulatory Visit: Payer: 59 | Admitting: Psychiatry

## 2023-07-01 ENCOUNTER — Encounter: Payer: Self-pay | Admitting: Psychiatry

## 2023-07-01 VITALS — BP 118/84 | HR 112 | Temp 98.1°F | Ht 64.0 in | Wt 134.0 lb

## 2023-07-01 DIAGNOSIS — F3342 Major depressive disorder, recurrent, in full remission: Secondary | ICD-10-CM | POA: Diagnosis not present

## 2023-07-01 DIAGNOSIS — F330 Major depressive disorder, recurrent, mild: Secondary | ICD-10-CM

## 2023-07-01 DIAGNOSIS — F17200 Nicotine dependence, unspecified, uncomplicated: Secondary | ICD-10-CM | POA: Diagnosis not present

## 2023-07-01 DIAGNOSIS — F411 Generalized anxiety disorder: Secondary | ICD-10-CM

## 2023-07-01 MED ORDER — VARENICLINE TARTRATE 0.5 MG PO TABS
0.5000 mg | ORAL_TABLET | Freq: Two times a day (BID) | ORAL | 1 refills | Status: DC
Start: 2023-07-01 — End: 2023-09-04

## 2023-07-01 NOTE — Patient Instructions (Signed)
 Varenicline Tablets What is this medication? VARENICLINE (var e NI kleen) helps you quit smoking. It reduces cravings for nicotine, the addictive substance found in tobacco. It is most effective when used in combination with a stop-smoking program. This medicine may be used for other purposes; ask your health care provider or pharmacist if you have questions. COMMON BRAND NAME(S): Chantix What should I tell my care team before I take this medication? They need to know if you have any of these conditions: Heart disease Frequently drink alcohol Kidney disease Mental health condition On hemodialysis Seizures History of stroke Suicidal thoughts, plans, or attempt by you or a family member An unusual or allergic reaction to varenicline, other medications, foods, dyes, or preservatives Pregnant or trying to get pregnant Breast-feeding How should I use this medication? Take this medication by mouth after eating. Take with a full glass of water. Follow the directions on the prescription label. Take your doses at regular intervals. Do not take your medication more often than directed. There are 3 ways you can use this medication to help you quit smoking; talk to your care team to decide which plan is right for you: 1) you can choose a quit date and start this medication 1 week before the quit date, or, 2) you can start taking this medication before you choose a quit date, and then pick a quit date between day 8 and 35 days of treatment, or, 3) if you are not sure that you are able or willing to quit smoking right away, start taking this medication and slowly decrease the amount you smoke as directed by your care team with the goal of being cigarette-free by week 12 of treatment. Stick to your plan; ask about support groups or other ways to help you remain cigarette-free. If you are motivated to quit smoking and did not succeed during a previous attempt with this medication for reasons other than side  effects, or if you returned to smoking after this treatment, speak with your care team about whether another course of this medication may be right for you. A special MedGuide will be given to you by the pharmacist with each prescription and refill. Be sure to read this information carefully each time. Talk to your care team about the use of this medication in children. This medication is not approved for use in children. Overdosage: If you think you have taken too much of this medicine contact a poison control center or emergency room at once. NOTE: This medicine is only for you. Do not share this medicine with others. What if I miss a dose? If you miss a dose, take it as soon as you can. If it is almost time for your next dose, take only that dose. Do not take double or extra doses. What may interact with this medication? Alcohol Insulin Other medications used to help people quit smoking Theophylline Warfarin This list may not describe all possible interactions. Give your health care provider a list of all the medicines, herbs, non-prescription drugs, or dietary supplements you use. Also tell them if you smoke, drink alcohol, or use illegal drugs. Some items may interact with your medicine. What should I watch for while using this medication? It is okay if you do not succeed at your attempt to quit and have a cigarette. You can still continue your quit attempt and keep using this medication as directed. Just throw away your cigarettes and get back to your quit plan. Talk to your care team  before using other treatments to quit smoking. Using this medication with other treatments to quit smoking may increase the risk for side effects compared to using a treatment alone. This medication may affect your coordination, reaction time, or judgment. Do not drive or operate machinery until you know how this medication affects you. Sit up or stand slowly to reduce the risk of dizzy or fainting  spells. Decrease the number of alcoholic beverages that you drink during treatment with this medication until you know if this medication affects your ability to tolerate alcohol. Some people have experienced increased drunkenness (intoxication), unusual or sometimes aggressive behavior, or no memory of things that have happened (amnesia) during treatment with this medication. You may do unusual sleep behaviors or activities you do not remember the day after taking this medication. Activities include driving, making or eating food, talking on the phone, sexual activity, or sleep walking. Stop taking this medication and call your care team right away if you find out you have done activities like this. Patients and their families should watch out for new or worsening depression or thoughts of suicide. Also watch out for sudden changes in feelings such as feeling anxious, agitated, panicky, irritable, hostile, aggressive, impulsive, severely restless, overly excited and hyperactive, or not being able to sleep. If this happens, call your care team. If you have diabetes, and you quit smoking, the effects of insulin may be increased. You may need to reduce your insulin dose. Check with your care team about how you should adjust your insulin dose. What side effects may I notice from receiving this medication? Side effects that you should report to your care team as soon as possible: Allergic reactions or angioedema--skin rash, itching or hives, swelling of the face, eyes, lips, tongue, arms, or legs, trouble swallowing or breathing Heart attack--pain or tightness in the chest, shoulders, arms, or jaw, nausea, shortness of breath, cold or clammy skin, feeling faint or lightheaded Mood and behavior changes--anxiety, nervousness, confusion, hallucinations, irritability, hostility, thoughts of suicide or self-harm, worsening mood, feelings of depression Redness, blistering, peeling, or loosening of the skin,  including inside the mouth Stroke--sudden numbness or weakness of the face, arm, or leg, trouble speaking, confusion, trouble walking, loss of balance or coordination, dizziness, severe headache, change in vision Seizures Side effects that usually do not require medical attention (report to your care team if they continue or are bothersome): Constipation Drowsiness Gas Nausea Trouble sleeping Upset stomach Vivid dreams or nightmares Vomiting This list may not describe all possible side effects. Call your doctor for medical advice about side effects. You may report side effects to FDA at 1-800-FDA-1088. Where should I keep my medication? Keep out of the reach of children and pets. Store at room temperature between 15 and 30 degrees C (59 and 86 degrees F). Throw away any unused medication after the expiration date. NOTE: This sheet is a summary. It may not cover all possible information. If you have questions about this medicine, talk to your doctor, pharmacist, or health care provider.  2024 Elsevier/Gold Standard (2021-03-08 00:00:00)

## 2023-07-28 DIAGNOSIS — F411 Generalized anxiety disorder: Secondary | ICD-10-CM | POA: Diagnosis not present

## 2023-07-28 DIAGNOSIS — C50812 Malignant neoplasm of overlapping sites of left female breast: Secondary | ICD-10-CM | POA: Diagnosis not present

## 2023-07-28 DIAGNOSIS — F331 Major depressive disorder, recurrent, moderate: Secondary | ICD-10-CM | POA: Diagnosis not present

## 2023-08-27 DIAGNOSIS — F331 Major depressive disorder, recurrent, moderate: Secondary | ICD-10-CM | POA: Diagnosis not present

## 2023-08-27 DIAGNOSIS — F411 Generalized anxiety disorder: Secondary | ICD-10-CM | POA: Diagnosis not present

## 2023-08-27 DIAGNOSIS — C50412 Malignant neoplasm of upper-outer quadrant of left female breast: Secondary | ICD-10-CM | POA: Diagnosis not present

## 2023-09-03 ENCOUNTER — Inpatient Hospital Stay: Payer: 59 | Admitting: Internal Medicine

## 2023-09-03 ENCOUNTER — Telehealth: Payer: Self-pay | Admitting: Internal Medicine

## 2023-09-03 ENCOUNTER — Inpatient Hospital Stay: Payer: 59

## 2023-09-03 NOTE — Telephone Encounter (Signed)
 This patient sent message thru answering service to cancel her appt today for lab/ MD and to call her back to reschedule.

## 2023-09-04 ENCOUNTER — Encounter: Payer: Self-pay | Admitting: Psychiatry

## 2023-09-04 ENCOUNTER — Telehealth: Payer: Self-pay | Admitting: Psychiatry

## 2023-09-04 ENCOUNTER — Other Ambulatory Visit: Payer: Self-pay

## 2023-09-04 ENCOUNTER — Ambulatory Visit: Admitting: Psychiatry

## 2023-09-04 VITALS — BP 118/87 | HR 119 | Temp 98.0°F | Ht 64.0 in | Wt 129.4 lb

## 2023-09-04 DIAGNOSIS — F411 Generalized anxiety disorder: Secondary | ICD-10-CM | POA: Diagnosis not present

## 2023-09-04 DIAGNOSIS — F3342 Major depressive disorder, recurrent, in full remission: Secondary | ICD-10-CM

## 2023-09-04 DIAGNOSIS — F17200 Nicotine dependence, unspecified, uncomplicated: Secondary | ICD-10-CM

## 2023-09-04 MED ORDER — HYDROXYZINE HCL 25 MG PO TABS
12.5000 mg | ORAL_TABLET | Freq: Every evening | ORAL | 0 refills | Status: DC | PRN
Start: 2023-09-04 — End: 2023-11-03

## 2023-09-04 MED ORDER — VARENICLINE TARTRATE 1 MG PO TABS
1.0000 mg | ORAL_TABLET | Freq: Two times a day (BID) | ORAL | 1 refills | Status: AC
Start: 2023-09-04 — End: ?

## 2023-09-04 NOTE — Telephone Encounter (Signed)
 Noted.
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
## 2023-09-16 ENCOUNTER — Inpatient Hospital Stay: Payer: Self-pay | Attending: Internal Medicine

## 2023-09-16 ENCOUNTER — Encounter: Payer: Self-pay | Admitting: Internal Medicine

## 2023-09-16 ENCOUNTER — Inpatient Hospital Stay (HOSPITAL_BASED_OUTPATIENT_CLINIC_OR_DEPARTMENT_OTHER): Payer: Self-pay | Admitting: Internal Medicine

## 2023-09-16 VITALS — BP 118/85 | HR 110 | Temp 98.3°F | Resp 20 | Ht 64.0 in | Wt 129.4 lb

## 2023-09-16 DIAGNOSIS — R202 Paresthesia of skin: Secondary | ICD-10-CM | POA: Diagnosis not present

## 2023-09-16 DIAGNOSIS — Z08 Encounter for follow-up examination after completed treatment for malignant neoplasm: Secondary | ICD-10-CM

## 2023-09-16 DIAGNOSIS — R300 Dysuria: Secondary | ICD-10-CM

## 2023-09-16 DIAGNOSIS — R200 Anesthesia of skin: Secondary | ICD-10-CM | POA: Insufficient documentation

## 2023-09-16 DIAGNOSIS — C50812 Malignant neoplasm of overlapping sites of left female breast: Secondary | ICD-10-CM

## 2023-09-16 DIAGNOSIS — Z171 Estrogen receptor negative status [ER-]: Secondary | ICD-10-CM | POA: Diagnosis not present

## 2023-09-16 DIAGNOSIS — Z853 Personal history of malignant neoplasm of breast: Secondary | ICD-10-CM | POA: Insufficient documentation

## 2023-09-16 DIAGNOSIS — F17210 Nicotine dependence, cigarettes, uncomplicated: Secondary | ICD-10-CM | POA: Insufficient documentation

## 2023-09-16 LAB — CBC WITH DIFFERENTIAL/PLATELET
Abs Immature Granulocytes: 0.06 10*3/uL (ref 0.00–0.07)
Basophils Absolute: 0.1 10*3/uL (ref 0.0–0.1)
Basophils Relative: 1 %
Eosinophils Absolute: 0 10*3/uL (ref 0.0–0.5)
Eosinophils Relative: 0 %
HCT: 43.2 % (ref 36.0–46.0)
Hemoglobin: 14.8 g/dL (ref 12.0–15.0)
Immature Granulocytes: 1 %
Lymphocytes Relative: 24 %
Lymphs Abs: 2.7 10*3/uL (ref 0.7–4.0)
MCH: 35.7 pg — ABNORMAL HIGH (ref 26.0–34.0)
MCHC: 34.3 g/dL (ref 30.0–36.0)
MCV: 104.1 fL — ABNORMAL HIGH (ref 80.0–100.0)
Monocytes Absolute: 1.2 10*3/uL — ABNORMAL HIGH (ref 0.1–1.0)
Monocytes Relative: 11 %
Neutro Abs: 7.3 10*3/uL (ref 1.7–7.7)
Neutrophils Relative %: 63 %
Platelets: 347 10*3/uL (ref 150–400)
RBC: 4.15 MIL/uL (ref 3.87–5.11)
RDW: 12.6 % (ref 11.5–15.5)
WBC: 11.3 10*3/uL — ABNORMAL HIGH (ref 4.0–10.5)
nRBC: 0 % (ref 0.0–0.2)

## 2023-09-16 LAB — URINALYSIS, COMPLETE (UACMP) WITH MICROSCOPIC
Bilirubin Urine: NEGATIVE
Glucose, UA: NEGATIVE mg/dL
Ketones, ur: NEGATIVE mg/dL
Nitrite: NEGATIVE
Protein, ur: NEGATIVE mg/dL
Specific Gravity, Urine: 1.005 (ref 1.005–1.030)
pH: 5 (ref 5.0–8.0)

## 2023-09-16 LAB — VITAMIN B12: Vitamin B-12: 587 pg/mL (ref 180–914)

## 2023-09-16 LAB — COMPREHENSIVE METABOLIC PANEL WITH GFR
ALT: 17 U/L (ref 0–44)
AST: 27 U/L (ref 15–41)
Albumin: 4 g/dL (ref 3.5–5.0)
Alkaline Phosphatase: 82 U/L (ref 38–126)
Anion gap: 11 (ref 5–15)
BUN: 9 mg/dL (ref 6–20)
CO2: 24 mmol/L (ref 22–32)
Calcium: 9.2 mg/dL (ref 8.9–10.3)
Chloride: 99 mmol/L (ref 98–111)
Creatinine, Ser: 0.71 mg/dL (ref 0.44–1.00)
GFR, Estimated: >60 mL/min (ref >60)
Glucose, Bld: 113 mg/dL — ABNORMAL HIGH (ref 70–99)
Potassium: 4.5 mmol/L (ref 3.5–5.1)
Sodium: 134 mmol/L — ABNORMAL LOW (ref 135–145)
Total Bilirubin: 0.8 mg/dL (ref 0.0–1.2)
Total Protein: 7.9 g/dL (ref 6.5–8.1)

## 2023-09-16 LAB — MAGNESIUM: Magnesium: 2 mg/dL (ref 1.7–2.4)

## 2023-09-16 MED ORDER — CIPROFLOXACIN HCL 500 MG PO TABS: 500.0000 mg | ORAL_TABLET | Freq: Two times a day (BID) | ORAL | 0 refills | Status: AC

## 2023-09-16 NOTE — Progress Notes (Signed)
 Patient states she believes she may have a kidney infection. Patient states she has been urinating a lot, and lower back pain.

## 2023-09-16 NOTE — Assessment & Plan Note (Addendum)
#  OC 2025- Multifocal left breast cancer --ER/PR negative HER-2/neu POSITIVE; grade 3.  s/p mainatence Herceptin -Perjeta . mammo bil- DEC 2024-- WNL. Clinically no evidence of recurrence.  Monitor on clinical basis.  stable     # PN [clinical trial]-grade-1; .? Foot drop [poor tol to gabapentin ]; s/p accu puncture x 2.  EMG with Dr.Shah [dec 15th].  stable     # increased frequency of urination/ back pain- no fever or chills Urine and culture- start ciproloxacin 500 mg BID.   # Depression/Anxiety/insomnia/chemo brain-  on Remeron  to 15 mg qhs- stable     # Active smoker-recommend quitting smoking. Currently 2 pack/week-  stable     #  CT- [facility]- JULT 2025-  sta;[PCP]  my chart- TM # DISPOSITION:  # Urine and culture-  # follow up in 12  months- MD; labs- cbc/cmp/ca-27-29- -Dr.B
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
## 2023-09-17 LAB — CANCER ANTIGEN 27.29: CA 27.29: 20.9 U/mL (ref 0.0–38.6)

## 2023-09-18 LAB — URINE CULTURE: Culture: NO GROWTH

## 2023-09-27 DIAGNOSIS — F3110 Bipolar disorder, current episode manic without psychotic features, unspecified: Secondary | ICD-10-CM | POA: Diagnosis not present

## 2023-09-27 DIAGNOSIS — F411 Generalized anxiety disorder: Secondary | ICD-10-CM | POA: Diagnosis not present

## 2023-09-27 DIAGNOSIS — C50412 Malignant neoplasm of upper-outer quadrant of left female breast: Secondary | ICD-10-CM | POA: Diagnosis not present

## 2023-10-27 DIAGNOSIS — F331 Major depressive disorder, recurrent, moderate: Secondary | ICD-10-CM | POA: Diagnosis not present

## 2023-10-27 DIAGNOSIS — F411 Generalized anxiety disorder: Secondary | ICD-10-CM | POA: Diagnosis not present

## 2023-10-27 DIAGNOSIS — C50412 Malignant neoplasm of upper-outer quadrant of left female breast: Secondary | ICD-10-CM | POA: Diagnosis not present

## 2023-11-03 ENCOUNTER — Other Ambulatory Visit: Payer: Self-pay | Admitting: Psychiatry

## 2023-11-03 DIAGNOSIS — F411 Generalized anxiety disorder: Secondary | ICD-10-CM

## 2023-11-03 DIAGNOSIS — F3342 Major depressive disorder, recurrent, in full remission: Secondary | ICD-10-CM

## 2023-12-09 ENCOUNTER — Telehealth: Admitting: Psychiatry

## 2024-02-27 NOTE — Progress Notes (Signed)
 Name Chanteria Haggard  Date of Birth [birth year]  Discharge Date: 2024-02-27  Discharged Reason Goals Met: Member-determined  Discharging to: Referring Specialist Referring Provider: Tinnie Dawn  Current Psychiatric Medications Remeron  Oral Tablet 15 MG 1 po qHS 2. Melatonin Oral Capsule 5 MG 2 po qHS   Psychiatric Medication Recommendation Transition N/A (no CC med recs made; Member has external psychiatrist for med management)  Referrals: No referrals  Discharge Summary Care Delivered: Member attended multiple [facility] and Coach Visits over 7 months.  Member improved from initial evaluation in November 2024 to July 2025 PHQ-9: 10 -> 1 GAD-7: 7 -> 2 QoL improvement: 24%  Current status: Reason for discharge: Member requested, goals met. Member reports reduced anxiety and has learned coping techniques.  Should referring organization contact?: Unknown if Member needs/is interested in additional support  Final Intake Summary Sherrilyn is a 53 y/o white female dx'd with stage IA ER/PR-, HER2+ positive breast cancer in 01/2019, referred by Tinnie Dawn at [facility] for depression, anxiety, and insomnia. Elecia is now cancer-free but still gets routine checkups at [facility]. She was referred to psychiatry for med management alongside CC referral, and understands no psych med recs will be made via CC unless we are sole prescriber. At time of CC referral, Adlene has diagnoses of MDD, GAD, tobacco use disorder, and mixed adjustment in her chart.  Assessments: Atalia scored PHQ-9=10 (moderate), reporting low mood, fatigue, fluctuating eating patterns, hypersomnia after her ex-husband left (9/8), chronic trouble staying asleep, feeling bad about herself every day, and passive SI on several days over the past 2 weeks. CSSR-S score: Medium Risk. Graclynn reported specific thoughts as to method (jumping off a nearby bridge) in recent past, however not within the past 2 weeks. She stated multiple  times that she would never attempt suicide, citing her son and fear as main protective factors. No prior attempts reported, but does have hx of SI dating back to youth. Member denied any level of intention or planning around jumping off the nearby bridge.  Nat scored GAD-7=7 (mild), reporting several days of irritability and nervousness, and over 1/2 the days worrying w/o ability to control.  Psych Tx/Hx: Miosha is currently prescribed Remeron  15mg  nightly for sleep. In the past she has taken citalopram, gabapentin , hydroxyzine , trazodone , and effexor . She has been involved in therapy and psychiatry in the past but has not been for several years. She is now reengaging in both d/t unexpected separation from her husband.  Psychosocial Hx: Maranda is recently separated, and now living alone. She has a 24 y/o son named Aloha who she is close with. 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 Lately she has been working on being more active and works out every AM at home.  Assigned Dx: F43.23: adjustment disorder with mixed anxiety & depressed mood  [place] Care Provider: Kedric Severin - Health Coach

## 2024-05-27 ENCOUNTER — Other Ambulatory Visit: Payer: Self-pay | Admitting: Family Medicine

## 2024-05-27 DIAGNOSIS — R220 Localized swelling, mass and lump, head: Secondary | ICD-10-CM

## 2024-09-17 ENCOUNTER — Ambulatory Visit: Admitting: Internal Medicine

## 2024-09-17 ENCOUNTER — Other Ambulatory Visit
